# Patient Record
Sex: Male | Born: 1960
Health system: Southern US, Community
[De-identification: ages and names within clinical notes are randomized; demographics above are authoritative.]

## PROBLEM LIST (undated history)

## (undated) DIAGNOSIS — R51 Headache: Secondary | ICD-10-CM

## (undated) DIAGNOSIS — R519 Headache, unspecified: Secondary | ICD-10-CM

## (undated) DIAGNOSIS — R42 Dizziness and giddiness: Secondary | ICD-10-CM

## (undated) DIAGNOSIS — L570 Actinic keratosis: Secondary | ICD-10-CM

## (undated) DIAGNOSIS — I4891 Unspecified atrial fibrillation: Secondary | ICD-10-CM

## (undated) DIAGNOSIS — G4733 Obstructive sleep apnea (adult) (pediatric): Secondary | ICD-10-CM

## (undated) DIAGNOSIS — J392 Other diseases of pharynx: Secondary | ICD-10-CM

## (undated) DIAGNOSIS — J349 Unspecified disorder of nose and nasal sinuses: Secondary | ICD-10-CM

## (undated) DIAGNOSIS — H269 Unspecified cataract: Secondary | ICD-10-CM

## (undated) DIAGNOSIS — H539 Unspecified visual disturbance: Secondary | ICD-10-CM

## (undated) DIAGNOSIS — M799 Soft tissue disorder, unspecified: Secondary | ICD-10-CM

## (undated) DIAGNOSIS — I10 Essential (primary) hypertension: Secondary | ICD-10-CM

## (undated) DIAGNOSIS — H9390 Unspecified disorder of ear, unspecified ear: Secondary | ICD-10-CM

## (undated) DIAGNOSIS — G4730 Sleep apnea, unspecified: Secondary | ICD-10-CM

## (undated) HISTORY — DX: Obstructive sleep apnea (adult) (pediatric): G47.33

## (undated) HISTORY — DX: Actinic keratosis: L57.0

## (undated) HISTORY — PX: NECK SURGERY: SHX720

## (undated) HISTORY — DX: Dizziness and giddiness: R42

## (undated) HISTORY — PX: WISDOM TOOTH EXTRACTION: SHX21

## (undated) HISTORY — DX: Headache, unspecified: R51.9

## (undated) HISTORY — DX: Headache: R51

## (undated) HISTORY — DX: Unspecified atrial fibrillation: I48.91

## (undated) HISTORY — PX: COLONOSCOPY: SHX174

## (undated) HISTORY — DX: Sleep apnea, unspecified: G47.30

## (undated) HISTORY — DX: Unspecified visual disturbance: H53.9

## (undated) HISTORY — DX: Unspecified disorder of nose and nasal sinuses: J34.9

---

## 1996-02-29 ENCOUNTER — Ambulatory Visit: Admit: 1996-02-29 | Disposition: A | Discharge status: Home / Self Care | Payer: Self-pay

## 1998-04-25 ENCOUNTER — Other Ambulatory Visit: Payer: Self-pay

## 1998-04-25 ENCOUNTER — Ambulatory Visit: Admit: 1998-04-25 | Disposition: A | Discharge status: Home / Self Care | Payer: Self-pay | Admitting: Gastroenterology

## 2001-04-17 ENCOUNTER — Ambulatory Visit: Admit: 2001-04-17 | Disposition: A | Discharge status: Home / Self Care | Payer: Self-pay | Admitting: Gastroenterology

## 2001-04-17 ENCOUNTER — Other Ambulatory Visit: Payer: Self-pay

## 2001-12-15 ENCOUNTER — Other Ambulatory Visit: Payer: Self-pay

## 2001-12-15 ENCOUNTER — Ambulatory Visit: Admit: 2001-12-15 | Disposition: A | Discharge status: Home / Self Care | Payer: Self-pay | Admitting: Urology

## 2002-09-06 DIAGNOSIS — G4733 Obstructive sleep apnea (adult) (pediatric): Secondary | ICD-10-CM

## 2002-09-06 HISTORY — DX: Obstructive sleep apnea (adult) (pediatric): G47.33

## 2002-12-03 ENCOUNTER — Ambulatory Visit (HOSPITAL_BASED_OUTPATIENT_CLINIC_OR_DEPARTMENT_OTHER): Admission: RE | Admit: 2002-12-03 | Discharge: 2002-12-03 | Payer: Self-pay | Admitting: Pulmonary Disease

## 2002-12-03 ENCOUNTER — Encounter: Payer: Self-pay | Admitting: Pulmonary Disease

## 2003-06-12 ENCOUNTER — Encounter: Payer: Self-pay | Admitting: Family Medicine

## 2003-06-12 ENCOUNTER — Encounter: Admission: RE | Admit: 2003-06-12 | Discharge: 2003-06-12 | Payer: Self-pay | Admitting: Family Medicine

## 2004-08-03 ENCOUNTER — Ambulatory Visit: Payer: Self-pay | Admitting: Family Medicine

## 2004-08-11 ENCOUNTER — Ambulatory Visit: Payer: Self-pay | Admitting: Family Medicine

## 2005-05-11 ENCOUNTER — Ambulatory Visit
Admission: AD | Admit: 2005-05-11 | Disposition: A | Discharge status: Home / Self Care | Payer: Self-pay | Source: Ambulatory Visit | Admitting: Internal Medicine

## 2005-08-05 ENCOUNTER — Encounter: Admission: RE | Admit: 2005-08-05 | Discharge: 2005-08-05 | Payer: Self-pay | Admitting: Family Medicine

## 2005-08-05 ENCOUNTER — Ambulatory Visit: Payer: Self-pay | Admitting: Family Medicine

## 2005-10-01 ENCOUNTER — Ambulatory Visit: Payer: Self-pay | Admitting: Family Medicine

## 2005-10-08 ENCOUNTER — Ambulatory Visit: Payer: Self-pay | Admitting: Family Medicine

## 2005-10-15 ENCOUNTER — Encounter: Payer: Self-pay | Admitting: Cardiovascular Disease

## 2005-10-15 ENCOUNTER — Ambulatory Visit: Payer: Self-pay | Admitting: Internal Medicine

## 2006-02-18 ENCOUNTER — Ambulatory Visit: Payer: Self-pay | Admitting: Sports Medicine

## 2006-04-05 ENCOUNTER — Ambulatory Visit: Payer: Self-pay | Admitting: Family Medicine

## 2006-08-17 ENCOUNTER — Ambulatory Visit
Admission: RE | Admit: 2006-08-17 | Disposition: A | Discharge status: Home / Self Care | Payer: Self-pay | Source: Ambulatory Visit | Admitting: Gastroenterology

## 2006-10-17 ENCOUNTER — Ambulatory Visit: Payer: Self-pay | Admitting: Family Medicine

## 2006-10-17 LAB — CONVERTED CEMR LAB: Digitoxin Lvl: 1.4 ng/mL (ref 0.8–2.0)

## 2006-11-10 ENCOUNTER — Ambulatory Visit: Payer: Self-pay | Admitting: Family Medicine

## 2006-11-10 LAB — CONVERTED CEMR LAB
Albumin: 4.2 g/dL (ref 3.5–5.2)
Alkaline Phosphatase: 50 units/L (ref 39–117)
BUN: 18 mg/dL (ref 6–23)
Basophils Absolute: 0 10*3/uL (ref 0.0–0.1)
Basophils Relative: 0.4 % (ref 0.0–1.0)
CO2: 31 meq/L (ref 19–32)
Eosinophils Absolute: 0.1 10*3/uL (ref 0.0–0.6)
Eosinophils Relative: 2.6 % (ref 0.0–5.0)
GFR calc Af Amer: 76 mL/min
GFR calc non Af Amer: 63 mL/min
HCT: 44.2 % (ref 39.0–52.0)
LDL Cholesterol: 73 mg/dL (ref 0–99)
Monocytes Absolute: 0.4 10*3/uL (ref 0.2–0.7)
Neutro Abs: 2.9 10*3/uL (ref 1.4–7.7)
Potassium: 3.6 meq/L (ref 3.5–5.1)
RBC: 5.09 M/uL (ref 4.22–5.81)
RDW: 12.3 % (ref 11.5–14.6)
Total CHOL/HDL Ratio: 4.3
Total Protein: 6.8 g/dL (ref 6.0–8.3)
VLDL: 34 mg/dL (ref 0–40)
WBC: 4.7 10*3/uL (ref 4.5–10.5)

## 2006-11-17 ENCOUNTER — Ambulatory Visit: Payer: Self-pay | Admitting: Family Medicine

## 2007-04-28 DIAGNOSIS — R51 Headache: Secondary | ICD-10-CM

## 2007-04-28 DIAGNOSIS — R519 Headache, unspecified: Secondary | ICD-10-CM | POA: Insufficient documentation

## 2007-04-28 DIAGNOSIS — I4891 Unspecified atrial fibrillation: Secondary | ICD-10-CM

## 2007-11-24 ENCOUNTER — Telehealth: Payer: Self-pay | Admitting: Family Medicine

## 2007-12-27 ENCOUNTER — Ambulatory Visit: Payer: Self-pay | Admitting: Family Medicine

## 2007-12-27 LAB — CONVERTED CEMR LAB
Basophils Absolute: 0.2 10*3/uL — ABNORMAL HIGH (ref 0.0–0.1)
Basophils Relative: 3.5 % — ABNORMAL HIGH (ref 0.0–1.0)
Bilirubin Urine: NEGATIVE
Calcium: 9.3 mg/dL (ref 8.4–10.5)
Cholesterol: 143 mg/dL (ref 0–200)
Creatinine, Ser: 1.3 mg/dL (ref 0.4–1.5)
Digitoxin Lvl: 0.7 ng/mL — ABNORMAL LOW (ref 0.8–2.0)
Eosinophils Absolute: 0.1 10*3/uL (ref 0.0–0.7)
GFR calc non Af Amer: 63 mL/min
Glucose, Urine, Semiquant: NEGATIVE
Hemoglobin: 15.3 g/dL (ref 13.0–17.0)
Ketones, urine, test strip: NEGATIVE
MCHC: 33.8 g/dL (ref 30.0–36.0)
MCV: 88 fL (ref 78.0–100.0)
Neutro Abs: 2.9 10*3/uL (ref 1.4–7.7)
RBC: 5.16 M/uL (ref 4.22–5.81)
TSH: 2.23 microintl units/mL (ref 0.35–5.50)
Total Bilirubin: 0.8 mg/dL (ref 0.3–1.2)
VLDL: 21 mg/dL (ref 0–40)
pH: >=9

## 2008-01-02 ENCOUNTER — Encounter: Payer: Self-pay | Admitting: Family Medicine

## 2008-01-03 ENCOUNTER — Ambulatory Visit: Payer: Self-pay | Admitting: Family Medicine

## 2008-01-24 ENCOUNTER — Ambulatory Visit: Payer: Self-pay | Admitting: Family Medicine

## 2008-01-28 DIAGNOSIS — L570 Actinic keratosis: Secondary | ICD-10-CM

## 2008-11-28 ENCOUNTER — Telehealth: Payer: Self-pay | Admitting: Family Medicine

## 2009-02-10 ENCOUNTER — Telehealth: Payer: Self-pay | Admitting: Family Medicine

## 2009-03-17 ENCOUNTER — Ambulatory Visit: Payer: Self-pay | Admitting: Family Medicine

## 2009-03-17 LAB — CONVERTED CEMR LAB
ALT: 22 units/L (ref 0–53)
Albumin: 4.3 g/dL (ref 3.5–5.2)
BUN: 15 mg/dL (ref 6–23)
Basophils Relative: 0.4 % (ref 0.0–3.0)
Bilirubin Urine: NEGATIVE
Chloride: 105 meq/L (ref 96–112)
Cholesterol: 148 mg/dL (ref 0–200)
Digitoxin Lvl: 2.3 ng/mL — ABNORMAL HIGH (ref 0.8–2.0)
Eosinophils Relative: 3.1 % (ref 0.0–5.0)
HCT: 44.3 % (ref 39.0–52.0)
Hemoglobin: 15 g/dL (ref 13.0–17.0)
Ketones, urine, test strip: NEGATIVE
LDL Cholesterol: 83 mg/dL (ref 0–99)
Lymphs Abs: 1.1 10*3/uL (ref 0.7–4.0)
MCV: 87.4 fL (ref 78.0–100.0)
Monocytes Absolute: 0.3 10*3/uL (ref 0.1–1.0)
Neutro Abs: 2.4 10*3/uL (ref 1.4–7.7)
Nitrite: NEGATIVE
PSA: 0.91 ng/mL (ref 0.10–4.00)
Platelets: 163 10*3/uL (ref 150.0–400.0)
Potassium: 3.9 meq/L (ref 3.5–5.1)
Specific Gravity, Urine: 1.025
TSH: 2.38 microintl units/mL (ref 0.35–5.50)
Total Bilirubin: 1 mg/dL (ref 0.3–1.2)
Total Protein: 6.9 g/dL (ref 6.0–8.3)
WBC: 3.9 10*3/uL — ABNORMAL LOW (ref 4.5–10.5)

## 2009-03-18 ENCOUNTER — Encounter: Payer: Self-pay | Admitting: Family Medicine

## 2009-03-24 ENCOUNTER — Ambulatory Visit: Payer: Self-pay | Admitting: Family Medicine

## 2009-04-10 ENCOUNTER — Encounter: Payer: Self-pay | Admitting: Gastroenterology

## 2010-04-13 ENCOUNTER — Telehealth: Payer: Self-pay | Admitting: Family Medicine

## 2010-04-28 ENCOUNTER — Ambulatory Visit: Payer: Self-pay | Admitting: Family Medicine

## 2010-04-28 LAB — CONVERTED CEMR LAB
ALT: 22 units/L (ref 0–53)
Albumin: 4.2 g/dL (ref 3.5–5.2)
Alkaline Phosphatase: 50 units/L (ref 39–117)
Basophils Relative: 0.3 % (ref 0.0–3.0)
Bilirubin, Direct: 0.2 mg/dL (ref 0.0–0.3)
CO2: 29 meq/L (ref 19–32)
Calcium: 9 mg/dL (ref 8.4–10.5)
Chloride: 104 meq/L (ref 96–112)
Digitoxin Lvl: 2.9 ng/mL (ref 0.8–2.0)
Eosinophils Absolute: 0.1 10*3/uL (ref 0.0–0.7)
Eosinophils Relative: 2.6 % (ref 0.0–5.0)
Hemoglobin: 14.5 g/dL (ref 13.0–17.0)
LDL Cholesterol: 58 mg/dL (ref 0–99)
Lymphocytes Relative: 22.7 % (ref 12.0–46.0)
MCHC: 33.5 g/dL (ref 30.0–36.0)
MCV: 88.3 fL (ref 78.0–100.0)
Neutro Abs: 3.2 10*3/uL (ref 1.4–7.7)
Neutrophils Relative %: 66.6 % (ref 43.0–77.0)
Nitrite: NEGATIVE
RBC: 4.9 M/uL (ref 4.22–5.81)
Sodium: 143 meq/L (ref 135–145)
Specific Gravity, Urine: >=1.03
Total CHOL/HDL Ratio: 4
Total Protein: 6.2 g/dL (ref 6.0–8.3)
Urobilinogen, UA: 0.2
WBC: 4.8 10*3/uL (ref 4.5–10.5)

## 2010-05-05 ENCOUNTER — Ambulatory Visit: Payer: Self-pay | Admitting: Family Medicine

## 2010-05-19 ENCOUNTER — Ambulatory Visit: Payer: Self-pay | Admitting: Family Medicine

## 2010-06-03 ENCOUNTER — Ambulatory Visit: Payer: Self-pay | Admitting: Internal Medicine

## 2010-06-03 ENCOUNTER — Ambulatory Visit: Payer: Self-pay | Admitting: Pulmonary Disease

## 2010-06-03 DIAGNOSIS — G4733 Obstructive sleep apnea (adult) (pediatric): Secondary | ICD-10-CM | POA: Insufficient documentation

## 2010-06-03 DIAGNOSIS — R0989 Other specified symptoms and signs involving the circulatory and respiratory systems: Secondary | ICD-10-CM

## 2010-06-03 DIAGNOSIS — R0609 Other forms of dyspnea: Secondary | ICD-10-CM

## 2010-06-14 ENCOUNTER — Emergency Department (HOSPITAL_COMMUNITY): Admission: EM | Admit: 2010-06-14 | Discharge: 2010-06-15 | Payer: Self-pay | Admitting: Emergency Medicine

## 2010-06-16 ENCOUNTER — Encounter: Payer: Self-pay | Admitting: Pulmonary Disease

## 2010-06-19 ENCOUNTER — Ambulatory Visit: Payer: Self-pay | Admitting: Family Medicine

## 2010-06-19 DIAGNOSIS — R42 Dizziness and giddiness: Secondary | ICD-10-CM

## 2010-09-04 ENCOUNTER — Encounter: Payer: Self-pay | Admitting: Family Medicine

## 2010-09-29 ENCOUNTER — Encounter: Payer: Self-pay | Admitting: Family Medicine

## 2010-09-29 ENCOUNTER — Ambulatory Visit
Admission: RE | Admit: 2010-09-29 | Discharge: 2010-09-29 | Payer: Self-pay | Source: Home / Self Care | Attending: Family Medicine | Admitting: Family Medicine

## 2010-09-29 DIAGNOSIS — M5382 Other specified dorsopathies, cervical region: Secondary | ICD-10-CM | POA: Insufficient documentation

## 2010-09-29 NOTE — Progress Notes (Unsigned)
Subjective:     Patient ID: Jeff Gonzales is a 50 y.o. male.  Jeff Gonzales is a 50 year old, married male, nonsmoker, who comes in today with a 3 to 49-month history of a sensation of vibrations in the back of his neck.  Jeff Gonzales had a rather severe episode of vertigo last fall.  The vertigo resolved, but after, that Jeff Gonzales's had recurrent sensations of vibrations in the back of his neck.  It comes and goes.  It's not really pain is not, vertigo it's a sense of pain laying in the back of his neck.  Jeff Gonzales said no history of trauma.  Neurologic review of systems otherwise negative. {Common ambulatory SmartLinks:[id number]}  Review of Systems  HENT: Negative for ear pain.       13 Review of Systems  HENT: Negative for ear pain.    of systems negative Objective:   Physical Exam  Constitutional: Jeff Gonzales is oriented to person, place, and time. Jeff Gonzales appears well-developed and well-nourished.  HENT:  Head: Normocephalic and atraumatic.  Right Ear: External ear normal.  Left Ear: External ear normal.  Nose: Nose normal.  Mouth/Throat: Oropharynx is clear and moist.  Eyes: Conjunctivae and EOM are normal. Pupils are equal, round, and reactive to light.  Neck: Normal range of motion. Neck supple. No JVD present. No tracheal deviation present. No thyromegaly present.  Musculoskeletal: Normal range of motion.  Lymphadenopathy:    Jeff Gonzales has no cervical adenopathy.  Neurological: Jeff Gonzales is alert and oriented to person, place, and time. Jeff Gonzales has normal reflexes. Jeff Gonzales displays normal reflexes. No cranial nerve deficit. Jeff Gonzales exhibits normal muscle tone. Coordination normal.       Assessment:     The    Plan:     ***

## 2010-10-01 ENCOUNTER — Telehealth: Payer: Self-pay | Admitting: Family Medicine

## 2010-10-08 NOTE — Assessment & Plan Note (Signed)
Summary: back of neck vibrating//ccm   Vital Signs:  Patient profile:   50 year old male Weight:      200 pounds Temp:     98.2 degrees F oral BP sitting:   130 / 80  (left arm)  Vitals Entered By: Kern Reap CMA Duncan Dull) (September 29, 2010 12:21 PM) CC: vibration with neck and head   Primary Care Provider:  Roderick Pee MD  CC:  vibration with neck and head.  History of Present Illness: Physical Exam  Constitutional: He is oriented to person, place, and time. He appears well-developed and well-nourished.  HENT:  Head: Normocephalic and atraumatic.  Right Ear: External ear normal.  Left Ear: External ear normal.  Nose: Nose normal.  Mouth/Throat: Oropharynx is clear and moist.  Eyes: Conjunctivae and EOM are normal. Pupils are equal, round, and reactive to light.  Neck: Normal range of motion. Neck supple. No JVD present. No tracheal deviation present. No thyromegaly present.  Musculoskeletal: Normal range of motion.  Lymphadenopathy:    He has no cervical adenopathy.  Neurological: He is alert and oriented to person, place, and time. He has normal reflexes. He displays normal reflexes. No cranial nerve deficit. He exhibits normal muscle tone. Coordination normal.    Allergies: No Known Drug Allergies  Past History:  Past medical, surgical, family and social histories (including risk factors) reviewed, and no changes noted (except as noted below).  Past Medical History: Reviewed history from 06/03/2010 and no changes required. Atrial fibrillation Headache mild osa 2004--AHI 7/hr.  Past Surgical History: Reviewed history from 06/03/2010 and no changes required. neck surgery--age 64 removal of wisdom teeth  Family History: Reviewed history from 06/03/2010 and no changes required. Family History of Melanoma--mom  Social History: Reviewed history from 06/03/2010 and no changes required. Seperated children--2 Regular exercise-yes Former Smoker. quit in 1984.  1/2-1 ppd. Started age 30's.  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 No apparent alopecia or balding. Eyes:  No corneal or conjunctival inflammation noted. EOMI. Perrla. Funduscopic exam benign, without hemorrhages, exudates or papilledema. Vision grossly normal. Ears:  External ear exam shows no significant lesions or deformities.  Otoscopic examination reveals clear canals, tympanic membranes are intact bilaterally without bulging, retraction, inflammation or discharge. Hearing is grossly normal bilaterally. Nose:  External nasal examination shows no deformity or inflammation. Nasal mucosa are pink and moist without lesions or exudates. Mouth:  Oral mucosa and oropharynx without lesions or exudates.  Teeth in good repair. Neck:  No deformities, masses, or tenderness noted. Chest Wall:  No deformities, masses, tenderness or gynecomastia noted. Msk:  No deformity or scoliosis noted of thoracic or lumbar spine.   Pulses:  R and L carotid,radial,femoral,dorsalis pedis and posterior tibial pulses are full and equal bilaterally Extremities:  No clubbing, cyanosis, edema, or deformity noted with normal full range of motion of all joints.   Neurologic:  No cranial nerve deficits noted. Station and gait are normal. Plantar reflexes are down-going bilaterally. DTRs are symmetrical throughout. Sensory, motor and coordinative functions appear intact.   Problems:  Medical Problems Added: 1)  Dx of Cervical Disorder, Nos  (ICD-723.9)  Impression & Recommendations:  Problem # 1:  CERVICAL DISORDER, NOS (ICD-723.9) Assessment New  Orders: T-Cervical Spine Comp w/Flex & Ext (69629BM)  Complete Medication List: 1)  Lanoxin 0.25 Mg Tabs (Digoxin) .... Take 1  tablet by mouth once a day 2)  Quinidine Gluconate Cr 324 Mg Tbcr (Quinidine gluconate) .... Take 1 tablet by mouth five times a day 3)  Ativan 2 Mg Tabs (Lorazepam) .Marland Kitchen.. 1  by mouth two times a day as needed  Patient Instructions: 1)  go to the main office now for x-rays of your neck. 2)  Begin Motrin 600 mg twice daily with food.  I will call her the report on your x-rays Prescriptions: ATIVAN 2 MG TABS (LORAZEPAM) 1  by mouth two times a day as needed  #60 x 2   Entered and Authorized by:   Roderick Pee MD   Signed by:   Roderick Pee MD on 09/29/2010   Method used:   Print then Give to Patient   RxID:   [id number]    Orders Added: 1)  T-Cervical Spine Comp w/Flex & Ext [72052TC] 2)  Est. Patient Level III [14782]

## 2010-10-08 NOTE — Assessment & Plan Note (Signed)
Summary: disoriented feeling/pts head doesnt feel right/cjr   Vital Signs:  Patient profile:   50 year old male Weight:      198 pounds Temp:     98.3 degrees F oral BP sitting:   138 / 80  Vitals Entered By: Lynann Beaver CMA (June 19, 2010 11:13 AM) CC: rov Is Patient Diabetic? No Pain Assessment Patient in pain? no        Primary Care Provider:  Roderick Pee MD  CC:  rov.  History of Present Illness: Destine is a 50 year old single male, nonsmoker, who comes in today for evaluation of vertigo.  Over the weekend he began having a sense of imbalance.  By Sunday night he developed true vertigo became pale nauseated.  His girl friend took him to the emergency room.  They did a complete exam including brain scan all of which of course, were normal.  The vertigo, then went away.  He still feels like he is rocking on about an hour.  He developed anxiety over the vertigo  Current Medications (verified): 1)  Lanoxin 0.25 Mg Tabs (Digoxin) .... Take 1 Tablet By Mouth Once A Day 2)  Quinidine Gluconate Cr 324 Mg Tbcr (Quinidine Gluconate) .... Take 1 Tablet By Mouth Five Times A Day  Allergies (verified): No Known Drug Allergies  Past History:  Past medical, surgical, family and social histories (including risk factors) reviewed for relevance to current acute and chronic problems.  Past Medical History: Reviewed history from 06/03/2010 and no changes required. Atrial fibrillation Headache mild osa 2004--AHI 7/hr.  Past Surgical History: Reviewed history from 06/03/2010 and no changes required. neck surgery--age 70 removal of wisdom teeth  Family History: Reviewed history from 06/03/2010 and no changes required. Family History of Melanoma--mom  Social History: Reviewed history from 06/03/2010 and no changes required. Seperated children--2 Regular exercise-yes Former Smoker. quit in 1984. 1/2-1 ppd. Started age 43's.  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 No apparent alopecia or balding. Eyes:  No corneal or conjunctival inflammation noted. EOMI. Perrla. Funduscopic exam benign, without hemorrhages, exudates or papilledema. Vision grossly normal. Ears:  External ear exam shows no significant lesions or deformities.  Otoscopic examination reveals clear canals, tympanic membranes are intact bilaterally without bulging, retraction, inflammation or discharge. Hearing is grossly normal bilaterally. Nose:  External nasal examination shows no deformity or inflammation. Nasal mucosa are pink and moist without lesions or exudates. Mouth:  Oral mucosa and oropharynx without lesions or exudates.  Teeth in good repair. Neurologic:  No cranial nerve deficits noted. Station and gait are normal. Plantar reflexes are down-going bilaterally. DTRs are symmetrical throughout. Sensory, motor and coordinative functions appear intact.   Problems:  Medical Problems Added: 1)  Dx of Vertigo  (ICD-780.4)  Impression & Recommendations:  Problem # 1:  VERTIGO (ICD-780.4) Assessment New  Complete Medication List: 1)  Lanoxin 0.25 Mg Tabs (Digoxin) .... Take 1 tablet by mouth once a day 2)  Quinidine Gluconate Cr 324 Mg Tbcr (Quinidine gluconate) .... Take 1 tablet by mouth five times a day 3)  Ativan 0.5 Mg Tabs (Lorazepam) .... Take 1 tablet by mouth two times a day as needed  Patient Instructions: 1)  take Ativan .5 mg b.i.d., p.r.n. Prescriptions: ATIVAN 0.5 MG TABS (LORAZEPAM) Take 1 tablet by mouth two  times a day as needed  #30 x 1   Entered and Authorized by:   Roderick Pee MD   Signed by:   Roderick Pee MD on 06/19/2010   Method used:   Print then Give to Patient   RxID:   [id number]

## 2010-10-08 NOTE — Progress Notes (Signed)
Summary: Pt req to get Lanoxin & Quinidine lvls checked added to cpx labs  Phone Note Call from Patient Call back at Home Phone [phone]   Caller: Patient Summary of Call: Pt having cpx labs done on 04/28/10 and is req to get Lanoxin and Quinidine lvs added to cpx labs. Pls advise.          Initial call taken by: Lucy Antigua,  April 13, 2010 1:21 PM  Follow-up for Phone Call        ok to add.  He digoxin and quinidine level Follow-up by: Roderick Pee MD,  April 13, 2010 1:43 PM  Additional Follow-up for Phone Call Additional follow up Details #1::        Added to date pt is coming in for cpx labs.... done.  Additional Follow-up by: Debbra Riding,  April 13, 2010 3:41 PM

## 2010-10-08 NOTE — Assessment & Plan Note (Signed)
Summary: consult for very mild osa   Visit Type:  Initial Consult Copy to:  Kelle Darting MD Primary Provider/Referring Provider:  Roderick Pee MD  CC:  Sleep Consult. Marland Kitchen  History of Present Illness: The pt is a 50y/o male who I have been asked to see for OSA.  His chart is unavailable to me (at [facility]), but his sleep study from 2004 is available and showed very mild osa with AHI of only 7/hr.  The pt at that time decided to treat conservatively due to minimal symptomatology, and comes in today to revisit his sleep apnea.  He is primarily worried about his snoring disturbing his girlfriend's sleep, and whether his sleep apnea may be having an impact on his CV health.  He is still having loud snoring with an abnormal breathing pattern at times, and will have a gasping arousal on rare occasions.  He goes to bed btw 12-1am, and arises at 9am to start his day.  He states that he has always been a "slow riser", and doesn't feel any different now than in the past.  He can have mild intermittant sleep pressure during the day on 2/7 days during the week.  He denies any issues watching tv or movies in the evening, and has no sleepiness issues with driving.  His epworth score is only 2 today.  His weight has been stable over the years.  Problems Prior to Update: 1)  Sleep Apnea, Obstructive  (ICD-327.23) 2)  Snoring  (ICD-786.09) 3)  Actinic Keratosis, Head  (ICD-702.0) 4)  Physical Examination  (ICD-V70.0) 5)  Family History of Melanoma  (ICD-V16.8) 6)  Headache  (ICD-784.0) 7)  Atrial Fibrillation  (ICD-427.31)  Current Medications (verified): 1)  Lanoxin 0.25 Mg Tabs (Digoxin) .... Take 1 Tablet By Mouth Once A Day 2)  Quinidine Gluconate Cr 324 Mg Tbcr (Quinidine Gluconate) .... Take 1 Tablet By Mouth Five Times A Day 3)  Doxycycline Hyclate 100 Mg Caps (Doxycycline Hyclate) .... Take 1 Tablet By Mouth Two Times A Day For Acne  Allergies (verified): No Known Drug Allergies  Past  History:  Past Medical History: Atrial fibrillation Headache mild osa 2004--AHI 7/hr.  Past Surgical History: neck surgery--age 50 removal of wisdom teeth  Family History: Reviewed history from 04/28/2007 and no changes required. Family History of Melanoma--mom  Social History: Reviewed history from 01/03/2008 and no changes required. Seperated children--2 Regular exercise-yes Former Smoker. quit in 1984. 1/2-1 ppd. Started age 50's.  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  The patient denies shortness of breath with activity, shortness of breath at rest, productive cough, non-productive cough, coughing up blood, chest pain, irregular heartbeats, acid heartburn, indigestion, loss of appetite, weight change, abdominal pain, difficulty swallowing, sore throat, tooth/dental problems, headaches, nasal congestion/difficulty breathing through nose, sneezing, itching, ear ache, depression, hand/feet swelling, joint stiffness or pain, rash, change in color of mucus, and fever.    Vital Signs:  Patient profile:   50 year old male Height:      70.75 inches Weight:      201 pounds BMI:     28.33 O2 Sat:      98 % on Room air Pulse rate:   68 / minute BP sitting:   124 / 82  (left arm) Cuff size:   regular  Vitals Entered By: Carver Fila (June 03, 2010 10:44 AM)  O2 Flow:  Room air CC:  Sleep Consult.  Comments meds and allergies updated Phone number updated Carver Fila  June 03, 2010 10:45 AM    Physical Exam  General:  wd male in nad Eyes:  PERRLA and EOMI.   Nose:  moderate turbinate hypertrophy with narrowing. Mouth:  mild elongation of palate, very long uvula Neck:  no jvd, tmg, LN Lungs:  totally clear to auscultation Heart:  rrr, no mrg Abdomen:  soft and nontender, bs+ Extremities:  no edema noted, pulses intact distally no cyanosis  Neurologic:  alert and oriented, moves all  4.   Impression & Recommendations:  Problem # 1:  SLEEP APNEA, OBSTRUCTIVE (ICD-327.23) the pt has a h/o very mild osa with no significant symptoms, and returns today for re-evaluation primarily related to his girlfriends complaint about his snoring.  He has adequate sleep with no alertness issues 5/7 days, and I suspect the other days he has increased upper airway resistance with a little more sleep disruption.  He is not unhappy with his sleep or daytime alertness, and feels it is no different from his symptoms at the time of his sleep study in 2004.  It is very unlikely he has clinically significant sleep apnea, and certainly does not have increased CV risk at this point.  He is really focused on his snoring, and how that can be alleviated.  I have discussed with him dental appliance and upper airway surgery.  He would like to see ENT to discuss upper airway issues, and in the interim would like him to try nasal ICS to see if it will help with turbinate hypertrophy.  I have also given him literature on dental appliances, and can refer if he is interested.  Other Orders: Consultation Level IV (65784) ENT Referral (ENT)  Patient Instructions: 1)  try nasonex 2 sprays each nostril for next 2 weeks to see if helps 2)  consider dental appliance and upper airway surgery we discussed. 3)  can do an unattended home sleep study if you feel that you are more symptomatic during the day.

## 2010-10-08 NOTE — Letter (Signed)
Summary: Referral - not able to see patient  [facility]  [address] [place], [place] [address]   Phone: [phone]  Fax: [phone]    September 04, 2010    Tinnie Gens A. Tawanna Cooler, M.D. [address] [place], [place] [address]   Re:   Jeff Gonzales DOB:  1961-03-21 MRN:   [id number]    Dear Dr. Tawanna Cooler:  Thank you for your kind referral of the above patient.  We have attempted to schedule the recommended procedure Screening Colonoscopy but have not been able to schedule because:   X  The patient was not available by phone and/or has not returned our calls.  ___ The patient declined to schedule the procedure at this time.  We appreciate the referral and hope that we will have the opportunity to treat this patient in the future.    Sincerely,    [facility] [phone]

## 2010-10-08 NOTE — Assessment & Plan Note (Signed)
Summary: cpx/cjr   Vital Signs:  Patient profile:   50 year old male Height:      70.75 inches Weight:      200 pounds BMI:     28.19 Temp:     98.4 degrees F oral BP sitting:   120 / 80  (left arm) Cuff size:   regular  Vitals Entered By: Kathrynn Speed CMA (May 05, 2010 2:12 PM) CC: cpx, review labs, src Flu Vaccine Consent Questions     Do you have a history of severe allergic reactions to this vaccine? no    Any prior history of allergic reactions to egg and/or gelatin? no    Do you have a sensitivity to the preservative Thimersol? no    Do you have a past history of Guillan-Barre Syndrome? no    Do you currently have an acute febrile illness? no    Have you ever had a severe reaction to latex? no    Vaccine information given and explained to patient? yes    Are you currently pregnant? no    Lot Number:[id number]   Exp Date:03/06/2011   Site Given  Left Deltoid IM   CC:  cpx, review labs, and src.  History of Present Illness: Jeff Gonzales  is a 50 year old, divorced male nonsmoker who comes in today for general physical examination  He saw his been in excellent health.  He has had  no chronic health problems except for long-term atrial fibrillation.  It's been controlled with Lanoxin, .25 daily, and quinidine 324 mg 5 times daily.  We discussed this every year about trying to get off the quinidine and dig, however, he's been relaxant to do so since he feels well, and he's been in sinus rhythm for so many  years.  however this year.  He is agreeable to get a consult.  He also takes Zomig for migraine headaches.  However, the side effects, and the Zomig or worsen headaches, therefore, he has discontinued the stomach.  He states last for an hour and then go away.  He gets routine eye care, dental care, tetanus booster 03, seasonal flu shot today, will set up colonoscopy in GI.  He states that last year.  He got recorded message after he had been out of town that he was due for a  colonoscopy.  He actually never talk to anybody.  He was told he had anappointment and then called him and told him he missed the appointment.  He also complains of snoring.  We sent him for sleep apnea evaluation.  A couple years ago, which was negative however, he would like to reconsult with Jeff Gonzales  Current Medications (verified): 1)  Lanoxin 0.25 Mg Tabs (Digoxin) .... Take 1 Tablet By Mouth Once A Day 2)  Quinidine Gluconate Cr 324 Mg Tbcr (Quinidine Gluconate) .... Take 1 Tablet By Mouth Five Times A Day 3)  Zomig Zmt 5 Mg Tbdp (Zolmitriptan) .... Take 1 Tablet By Mouth  Allergies (verified): No Known Drug Allergies  Past History:  Past medical, surgical, family and social histories (including risk factors) reviewed, and no changes noted (except as noted below).  Past Medical History: Reviewed history from 04/28/2007 and no changes required. Atrial fibrillation Headache  Family History: Reviewed history from 04/28/2007 and no changes required. Family History of Melanoma  Social History: Reviewed history from 01/03/2008 and no changes required. 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 No apparent alopecia or balding. Eyes:  No corneal or conjunctival inflammation noted. EOMI. Perrla. Funduscopic exam benign, without hemorrhages, exudates or papilledema. Vision grossly normal. Ears:  External ear exam shows no significant lesions or deformities.  Otoscopic examination reveals clear canals, tympanic membranes are intact bilaterally without bulging, retraction, inflammation or discharge. Hearing is grossly normal bilaterally. Nose:  External nasal examination  shows no deformity or inflammation. Nasal mucosa are pink and moist without lesions or exudates. Mouth:  Oral mucosa and oropharynx without lesions or exudates.  Teeth in good repair. Neck:  No deformities, masses, or tenderness noted. Chest Wall:  No deformities, masses, tenderness or gynecomastia noted. Breasts:  No masses or gynecomastia noted Lungs:  Normal respiratory effort, chest expands symmetrically. Lungs are clear to auscultation, no crackles or wheezes. Heart:  Normal rate and regular rhythm. S1 and S2 normal without gallop, murmur, click, rub or other extra sounds. Abdomen:  Bowel sounds positive,abdomen soft and non-tender without masses, organomegaly or hernias noted. Rectal:  No external abnormalities noted. Normal sphincter tone. No rectal masses or tenderness. Genitalia:  Testes bilaterally descended without nodularity, tenderness or masses. No scrotal masses or lesions. No penis lesions or urethral discharge. Prostate:  Prostate gland firm and smooth, no enlargement, nodularity, tenderness, mass, asymmetry or induration. Msk:  No deformity or scoliosis noted of thoracic or lumbar spine.   Pulses:  R and L carotid,radial,femoral,dorsalis pedis and posterior tibial pulses are full and equal bilaterally Extremities:  No clubbing, cyanosis, edema, or deformity noted with normal full range of motion of all joints.   Neurologic:  No cranial nerve deficits noted. Station and gait are normal. Plantar reflexes are down-going bilaterally. DTRs are symmetrical throughout. Sensory, motor and coordinative functions appear intact. Skin:  Intact without suspicious lesions or rashes.... except for some mild acne on his back.  Will put him on some doxycycline 100 b.i.d. for a couple days Cervical Nodes:  No lymphadenopathy noted Axillary Nodes:  No palpable lymphadenopathy Inguinal Nodes:  No significant adenopathy Psych:  Cognition and judgment appear intact. Alert and cooperative with normal  attention span and concentration. No apparent delusions, illusions, hallucinations   Impression & Recommendations:  Problem # 1:  PHYSICAL EXAMINATION (ICD-V70.0) Assessment Unchanged  Orders: Prescription Created Electronically [phone]) EKG w/ Interpretation (93000)  Problem # 2:  HEADACHE (ICD-784.0) Assessment: Improved  The following medications were removed from the medication list:    Zomig Zmt 5 Mg Tbdp (Zolmitriptan) .Marland Kitchen... Take 1 tablet by mouth  Problem # 3:  ATRIAL FIBRILLATION (ICD-427.31) Assessment: Unchanged  His updated medication list for this problem includes:    Lanoxin 0.25 Mg Tabs (Digoxin) .Marland Kitchen... Take 1 tablet by mouth once a day    Quinidine Gluconate Cr 324 Mg Tbcr (Quinidine gluconate) .Marland Kitchen... Take 1 tablet by mouth five times a day  Orders: Venipuncture (91478) Prescription Created Electronically [id number]) EKG w/ Interpretation (93000) Cardiology Referral (Cardiology)  Complete Medication List: 1)  Lanoxin 0.25 Mg Tabs (Digoxin) .... Take 1 tablet by mouth once a day 2)  Quinidine Gluconate Cr 324 Mg Tbcr (Quinidine gluconate) .... Take 1 tablet by mouth five times a day 3)  Doxycycline Hyclate 100 Mg Caps (Doxycycline hyclate) .... Take 1 tablet by mouth two times a day for acne  Other Orders: Admin 1st Vaccine (13086) Flu Vaccine  1yrs + [phone]) Gastroenterology Referral (GI) Pulmonary Referral (Pulmonary)  Patient Instructions: 1)  take doxycycline 100 mg b.i.d. to clear up the  acne.  2)  Return in two weeks for follow-up.  Dig.  Level. 3)  I will call and make an appointment before you to  see Dr. Alinda Deem for a cardiac evaluation. 4)  I will also put a note in the system to have somebody call you personally about getting a screening colonoscopy. 5)  It is important that you exercise regularly at least 20 minutes 5 times a week. If you develop chest pain, have severe difficulty breathing, or feel very tired , stop exercising immediately and seek  medical attention. 6)  Take an Aspirin every day. 7)  I will also put a note to reconsult with Dr. Shelle Iron  Prescriptions: DOXYCYCLINE HYCLATE 100 MG CAPS (DOXYCYCLINE HYCLATE) Take 1 tablet by mouth two times a day for acne  #100 x 3   Entered and Authorized by:   Roderick Pee MD   Signed by:   Roderick Pee MD on 05/05/2010   Method used:   Electronically to        [facility]* (retail)       [address]       [place], [place]  [address]       Ph: [phone]       Fax: [phone]   RxID:   [id number] QUINIDINE GLUCONATE CR 324 MG TBCR (QUINIDINE GLUCONATE) Take 1 tablet by mouth five times a day  #500 x 3   Entered and Authorized by:   Roderick Pee MD   Signed by:   Roderick Pee MD on 05/05/2010   Method used:   Electronically to        [facility]* (retail)       [address]       [place], [place]  [id number]       Ph: [phone]       Fax: [phone]   RxID:   [id number] LANOXIN 0.25 MG TABS (DIGOXIN) Take 1 tablet by mouth once a day  #100 x 3   Entered and Authorized by:   Roderick Pee MD   Signed by:   Roderick Pee MD on 05/05/2010   Method used:   Electronically to        [facility]* (retail)       [address]       [place], [place]  [address]       Ph: [phone]       Fax: [phone]   RxID:   [id number]

## 2010-10-08 NOTE — Assessment & Plan Note (Signed)
Summary: nep/afib pt on digoxin & quindin/jml   Visit Type:  Initial Consult Primary Provider:  Roderick Pee MD   History of Present Illness: Jeff Gonzales is referred today by Dr. Tawanna Cooler for evaluation of atrial fibrillation.  His history is unusual in that he was diagnosed with atrial fib initially over 20 yrs ago.  He has had 3 episodes, each occurring once a year until he was placed on Quinidine by a [place] cardiologist.  Since then he has done well.  He has had occaisional palpitations but no symptomatic sustained atrial fibrillation.  He has not had syncope or c/p and continues to exercise regularly.  No peripheral edema.  Current Medications (verified): 1)  Lanoxin 0.25 Mg Tabs (Digoxin) .... Take 1 Tablet By Mouth Once A Day 2)  Quinidine Gluconate Cr 324 Mg Tbcr (Quinidine Gluconate) .... 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  Vital Signs:  Patient profile:   50 year old male Height:      70.75 inches Weight:      198 pounds BMI:     27.91 Pulse rate:   64 / minute BP sitting:   118 / 82  (left arm)  Vitals Entered By: Laurance Flatten CMA (June 03, 2010 12:21 PM)  Physical Exam  General:  Well-developed,well-nourished,in no acute distress; alert,appropriate and cooperative throughout examination Head:  Normocephalic and atraumatic without obvious abnormalities. No apparent alopecia or balding. Eyes:  PERRLA/EOM intact; conjunctiva and lids normal. Mouth:  Oral mucosa and oropharynx without lesions  or exudates.  Teeth in good repair. Neck:  No deformities, masses, or tenderness noted. Lungs:  Normal respiratory effort, chest expands symmetrically. Lungs are clear to auscultation, no crackles or wheezes. Heart:  Normal rate and regular rhythm. S1 and S2 normal without gallop, murmur, click, rub or other extra sounds. Abdomen:  Bowel sounds positive,abdomen soft and non-tender without masses, organomegaly or hernias noted. Msk:  No deformity or scoliosis noted of thoracic or lumbar spine.   Pulses:  R and L carotid,radial,femoral,dorsalis pedis and posterior tibial pulses are full and equal bilaterally Extremities:  No clubbing, cyanosis, edema, or deformity noted with normal full range of motion of all joints.   Neurologic:  Alert and oriented x 3.   EKG  Procedure date:  05/05/2010  Findings:      Normal sinus rhythm with rate of:  60.  Impression & Recommendations:  Problem # 1:  ATRIAL FIBRILLATION (ICD-427.31) I had an extensive discussion with the patient today regarding the etiology, pathophysiology, treatment and complications of atrial fibrillation.  As he has done well on his current meds, I have asked him to continue his quinidine and reduce his digoxin dose as below. His updated medication list for this problem includes:    Lanoxin 0.25 Mg Tabs (Digoxin) .Marland Kitchen... Take 1 tablet by mouth once a day    Quinidine Gluconate Cr 324 Mg Tbcr (Quinidine gluconate) .Marland Kitchen... Take 1 tablet by mouth five times a day  Patient Instructions: 1)  Your physician wants you to follow-up in: 2 years with  DrTaylor or as needed  You will receive a reminder letter in the mail two months in advance. If you don't receive a letter, please call our office to schedule the follow-up appointment.

## 2010-10-08 NOTE — Consult Note (Signed)
Summary: [facility]  [facility]   Imported By: Sherian Rein 07/13/2010 12:35:12  _____________________________________________________________________  External Attachment:    Type:   Image     Comment:   External Document

## 2010-10-08 NOTE — Progress Notes (Signed)
Summary: x-ray results  Phone Note Outgoing Call Call back at [facility] Phone [phone]   Caller: Patient Summary of Call: patient is aware of his x-ray resutls but would like a little more details about results and treatment plan.  he would like a call from dr Romolo Sieling.  Initial call taken by: Kern Reap CMA Duncan Dull),  October 01, 2010 12:33 PM Summary of Call: I reviewed the x-ray findings.  Recommended OTC Motrin, 600 mg b.i.d. with food.  If symptoms persist, and call, and we will get him set up for physical therapy Initial call taken by: Roderick Pee MD,  October 01, 2010 5:46 PM

## 2010-11-19 LAB — POCT CARDIAC MARKERS
CKMB, poc: <1 ng/mL — ABNORMAL LOW (ref 1.0–8.0)
Myoglobin, poc: 62.8 ng/mL (ref 12–200)
Troponin i, poc: <0.05 ng/mL (ref 0.00–0.09)

## 2010-11-19 LAB — CBC
Hemoglobin: 15 g/dL (ref 13.0–17.0)
MCHC: 33.6 g/dL (ref 30.0–36.0)
Platelets: 180 10*3/uL (ref 150–400)
RDW: 12.9 % (ref 11.5–15.5)

## 2010-11-19 LAB — COMPREHENSIVE METABOLIC PANEL
ALT: 25 U/L (ref 0–53)
Calcium: 9.6 mg/dL (ref 8.4–10.5)
GFR calc Af Amer: >60 mL/min (ref >60)
Glucose, Bld: 111 mg/dL — ABNORMAL HIGH (ref 70–99)
Sodium: 139 mEq/L (ref 135–145)
Total Protein: 7.2 g/dL (ref 6.0–8.3)

## 2010-11-19 LAB — DIFFERENTIAL
Eosinophils Absolute: 0 10*3/uL (ref 0.0–0.7)
Lymphs Abs: 1 10*3/uL (ref 0.7–4.0)
Monocytes Relative: 6 % (ref 3–12)
Neutrophils Relative %: 82 % — ABNORMAL HIGH (ref 43–77)

## 2011-01-26 ENCOUNTER — Other Ambulatory Visit: Payer: Self-pay | Admitting: Family Medicine

## 2011-04-24 ENCOUNTER — Emergency Department (HOSPITAL_COMMUNITY)
Admission: EM | Admit: 2011-04-24 | Discharge: 2011-04-24 | Disposition: A | Discharge status: Home / Self Care | Payer: 59 | Attending: Emergency Medicine | Admitting: Emergency Medicine

## 2011-04-24 ENCOUNTER — Emergency Department (HOSPITAL_COMMUNITY): Payer: 59

## 2011-04-24 DIAGNOSIS — X58XXXA Exposure to other specified factors, initial encounter: Secondary | ICD-10-CM | POA: Insufficient documentation

## 2011-04-24 DIAGNOSIS — I4891 Unspecified atrial fibrillation: Secondary | ICD-10-CM | POA: Insufficient documentation

## 2011-04-24 DIAGNOSIS — M25579 Pain in unspecified ankle and joints of unspecified foot: Secondary | ICD-10-CM | POA: Insufficient documentation

## 2011-04-24 DIAGNOSIS — S93499A Sprain of other ligament of unspecified ankle, initial encounter: Secondary | ICD-10-CM | POA: Insufficient documentation

## 2011-04-26 ENCOUNTER — Telehealth: Payer: Self-pay | Admitting: *Deleted

## 2011-04-26 NOTE — Telephone Encounter (Signed)
fyi patient went to ER for treatment.  He is scheduling follow up with ortho.

## 2011-04-26 NOTE — Telephone Encounter (Signed)
He can call [facility] To be seen today

## 2011-04-26 NOTE — Telephone Encounter (Signed)
[facility] Triage Call Report Triage Record Num: [id number] Operator: Craig Guess Patient Name: Jeff Gonzales Call Date & Time: 04/24/2011 2:43:16AM Patient Phone: [phone] PCP: Eugenio Hoes. Todd Patient Gender: Male PCP Fax : [phone] Patient DOB: 02-05-1961 Practice Name: [facility] Reason for Call: Caller reports he thinks he has ruptured his right achilles tendon while he was jumping a few hrs ago. Reports he felt a pop and had intense pain that caused him to fall. Caller is unable to ambulate without assistance and is asking for direction. Minimal swelling and no pain reported. Per Leg Injury, Caller advised to call the [place] office at [facility] to be seen in the am. Home care for sxs. He is agreeable. Protocol(s) Used: Leg Injury Recommended Outcome per Protocol: Provide Home/Self Care Override Outcome if Used in Protocol: See Provider within 24 hours RN Reason for Override Outcome: Nursing Judgement Used. Reason for Outcome: All other situations Care Advice: ~ Avoid activity that causes or worsens symptoms. Limit Swelling and Reduce Pain: - Rest the painful area and limit weight bearing or gripping, and any strenuous exercise, especially repetitive motion, playing tennis, or any activity that makes the pain worse. - Apply a cloth-covered cold pack to the extremity for no more than 20 minutes, 4 to 8 times a day. Cold helps relieve pain and swelling. - Apply an elastic bandage to the extremity to limit the swelling. Do not wrap extremity too tightly. - Elevate the extremity above the level of the heart. ~ Analgesic/Antipyretic Advice - NSAIDs: Consider aspirin, ibuprofen, naproxen or ketoprofen for pain or fever as directed on label or by pharmacist/provider. PRECAUTIONS: - If over 53 years of age, should not take longer than 1 week without consulting provider. EXCEPTIONS: - Should not be used if taking blood thinners or have bleeding problems. - Do not use if  have history of sensitivity/allergy to any of these medications; or history of cardiovascular, ulcer, kidney, liver disease or diabetes unless approved by provider. - Do not exceed recommended dose or frequency. ~ 04/24/2011 2:57:20AM Page 1 of 1 [id number]

## 2011-05-09 ENCOUNTER — Other Ambulatory Visit: Payer: Self-pay | Admitting: Family Medicine

## 2011-06-11 ENCOUNTER — Telehealth: Payer: Self-pay | Admitting: Family Medicine

## 2011-06-11 DIAGNOSIS — I4891 Unspecified atrial fibrillation: Secondary | ICD-10-CM

## 2011-06-11 NOTE — Telephone Encounter (Signed)
Patient is to have cpx labs--v70.0 on next Tuesday 06-15-2011. He would like to add his Digoxin and Quinidine labs added. Is this ok?

## 2011-06-14 NOTE — Telephone Encounter (Signed)
Labs have been added

## 2011-06-14 NOTE — Telephone Encounter (Signed)
ok 

## 2011-06-15 ENCOUNTER — Other Ambulatory Visit (INDEPENDENT_AMBULATORY_CARE_PROVIDER_SITE_OTHER): Payer: 59

## 2011-06-15 DIAGNOSIS — I4891 Unspecified atrial fibrillation: Secondary | ICD-10-CM

## 2011-06-15 DIAGNOSIS — Z0000 Encounter for general adult medical examination without abnormal findings: Secondary | ICD-10-CM

## 2011-06-15 LAB — HEPATIC FUNCTION PANEL
ALT: 25 U/L (ref 0–53)
Albumin: 4.7 g/dL (ref 3.5–5.2)
Total Bilirubin: 0.8 mg/dL (ref 0.3–1.2)

## 2011-06-15 LAB — POCT URINALYSIS DIPSTICK
Bilirubin, UA: NEGATIVE
Glucose, UA: NEGATIVE
Leukocytes, UA: NEGATIVE
Nitrite, UA: NEGATIVE
Urobilinogen, UA: 0.2
pH, UA: 7

## 2011-06-15 LAB — CBC WITH DIFFERENTIAL/PLATELET
Basophils Relative: 0.4 % (ref 0.0–3.0)
Hemoglobin: 15.1 g/dL (ref 13.0–17.0)
Lymphocytes Relative: 19.3 % (ref 12.0–46.0)
Monocytes Relative: 6.6 % (ref 3.0–12.0)
Neutro Abs: 4.5 10*3/uL (ref 1.4–7.7)
RBC: 5.23 Mil/uL (ref 4.22–5.81)
WBC: 6.3 10*3/uL (ref 4.5–10.5)

## 2011-06-15 LAB — LIPID PANEL
HDL: 35.3 mg/dL — ABNORMAL LOW (ref >39.00)
Total CHOL/HDL Ratio: 4
VLDL: 31.6 mg/dL (ref 0.0–40.0)

## 2011-06-15 LAB — BASIC METABOLIC PANEL
Calcium: 9.4 mg/dL (ref 8.4–10.5)
GFR: 77.9 mL/min (ref >60.00)
Sodium: 141 mEq/L (ref 135–145)

## 2011-06-15 LAB — TSH: TSH: 2.34 u[IU]/mL (ref 0.35–5.50)

## 2011-06-16 LAB — QUINIDINE LEVEL: Quinidine: 2.1 ug/mL (ref 2.0–5.0)

## 2011-06-24 ENCOUNTER — Encounter: Payer: Self-pay | Admitting: Family Medicine

## 2011-06-24 ENCOUNTER — Ambulatory Visit (INDEPENDENT_AMBULATORY_CARE_PROVIDER_SITE_OTHER): Payer: 59 | Admitting: Family Medicine

## 2011-06-24 DIAGNOSIS — I4891 Unspecified atrial fibrillation: Secondary | ICD-10-CM

## 2011-06-24 DIAGNOSIS — Z23 Encounter for immunization: Secondary | ICD-10-CM

## 2011-06-24 DIAGNOSIS — Z113 Encounter for screening for infections with a predominantly sexual mode of transmission: Secondary | ICD-10-CM

## 2011-06-24 MED ORDER — QUINIDINE GLUCONATE ER 324 MG PO TBCR: 324.0000 mg | EXTENDED_RELEASE_TABLET | Freq: Every day | ORAL | Status: DC

## 2011-06-24 MED ORDER — LORAZEPAM 1 MG PO TABS: 1.0000 mg | ORAL_TABLET | Freq: Two times a day (BID) | ORAL | Status: AC

## 2011-06-24 MED ORDER — DIGOXIN 250 MCG PO TABS: 250.0000 ug | ORAL_TABLET | ORAL | Status: DC

## 2011-06-24 NOTE — Progress Notes (Signed)
  Subjective:    Patient ID: Jeff Gonzales, male    DOB: 10/03/1960, 50 y.o.   MRN: [id number]  HPI Jeff Gonzales is a 50 year old single male, nonsmoker, who comes in today for a general physical examination because of a history of atrial fibrillation.  This occurred years ago, and he's been maintained on Lanoxin, .25 mg daily, and quinidine 324 mg 5 times daily.  A couple years ago.  We had him see a cardiologist.  In order to change his medication.  He was actively hospitalized and monitored.  He is content to take his current medication because it continued to work and is having no symptoms.  He recently had surgery torn right Achilles' tendon.  He has occasional issues over the last couple weeks with blood in his semen.  Also, his new girlfriend wants him to be tested for STDs, although he is asymptomatic.   Review of Systems  Constitutional: Negative.   HENT: Negative.   Eyes: Negative.   Respiratory: Negative.   Cardiovascular: Negative.   Gastrointestinal: Negative.   Genitourinary: Negative.   Musculoskeletal: Negative.   Skin: Negative.   Neurological: Negative.   Hematological: Negative.   Psychiatric/Behavioral: Negative.        Objective:   Physical Exam  Constitutional: He is oriented to person, place, and time. He appears well-developed and well-nourished.  HENT:  Head: Normocephalic and atraumatic.  Right Ear: External ear normal.  Left Ear: External ear normal.  Nose: Nose normal.  Mouth/Throat: Oropharynx is clear and moist.  Eyes: Conjunctivae and EOM are normal. Pupils are equal, round, and reactive to light.  Neck: Normal range of motion. Neck supple. No JVD present. No tracheal deviation present. No thyromegaly present.  Cardiovascular: Normal rate, regular rhythm, normal heart sounds and intact distal pulses.  Exam reveals no gallop and no friction rub.   No murmur heard. Pulmonary/Chest: Effort normal and breath sounds normal. No stridor. No respiratory  distress. He has no wheezes. He has no rales. He exhibits no tenderness.  Abdominal: Soft. Bowel sounds are normal. He exhibits no distension and no mass. There is no tenderness. There is no rebound and no guarding.  Genitourinary: Rectum normal, prostate normal and penis normal. Guaiac negative stool. No penile tenderness.  Musculoskeletal: Normal range of motion. He exhibits no edema and no tenderness.       He is wearing a boot on his right leg, where he had the Achilles' tendon repaired  Lymphadenopathy:    He has no cervical adenopathy.  Neurological: He is alert and oriented to person, place, and time. He has normal reflexes. No cranial nerve deficit. He exhibits normal muscle tone.  Skin: Skin is warm and dry. No rash noted. No erythema. No pallor.  Psychiatric: He has a normal mood and affect. His behavior is normal. Judgment and thought content normal.          Assessment & Plan:  Healthy male.  History of atrial fibrillation.  Continue Lanoxin quinidine.  Follow-up in one year.  History of mild anxiety.  Ativan 1 mg p.r.n.  Blood in semen reassured.  Urology consult p.r.n.  Ruptured right Achilles' tendon.  Healing.  Return in one year, sooner for any problems

## 2011-06-24 NOTE — Patient Instructions (Signed)
Continue your good health habits.  If the bleeding continues, I would hold the aspirin for a couple weeks.  If the bleeding will not resolve, then I would recommend a urology consult.  Follow-up in one year, sooner if any problems.  We will get a screening HIV test for reassurance and send you a copy.

## 2011-08-18 ENCOUNTER — Ambulatory Visit (INDEPENDENT_AMBULATORY_CARE_PROVIDER_SITE_OTHER): Payer: 59 | Admitting: Family Medicine

## 2011-08-18 ENCOUNTER — Encounter: Payer: Self-pay | Admitting: Family Medicine

## 2011-08-18 ENCOUNTER — Telehealth: Payer: Self-pay | Admitting: Family Medicine

## 2011-08-18 VITALS — BP 120/80 | HR 91 | Temp 98.1°F | Wt 204.0 lb

## 2011-08-18 DIAGNOSIS — IMO0002 Reserved for concepts with insufficient information to code with codable children: Secondary | ICD-10-CM

## 2011-08-18 MED ORDER — DOXYCYCLINE HYCLATE 100 MG PO TABS: 100.0000 mg | ORAL_TABLET | Freq: Two times a day (BID) | ORAL | Status: AC

## 2011-08-18 NOTE — Progress Notes (Signed)
  Subjective:    Patient ID: Jeff Gonzales, male    DOB: [birth year], 50 y.o.   MRN: [id number]  HPI 50 year old white male, former smoker, and with complaints of red, swollen, tender on his right hand has been present for 3 days and worsening. He has been trying saltwater soaks in warm water soaks at home with no relief. He is unsure of what happens with. No injury. It   Review of Systems Review of systems as stated above otherwise negative    Objective:   Physical Exam  Alert and oriented, anxious, in no acute distress. Lungs: Clear cardiac: Regular rate and rhythm skin: Right shows a moderate sized paronychia.  Incision and drainage: Under clean technique, area was excised with an 18-gauge needle. A moderate amount of purulent drainage from the thumb. The thumb was soaked in peroxide for 10 minutes. The results achieved. Triple antibiotic ointment applied topically and wrapped with a bandage.      Assessment & Plan:  Assessment: Paronychia  Plan: Doxycycline 100 mg twice a day for 7 days #14 with no additional refills. Saltwater soaks at home when necessary. Call if symptoms worsen or persist. Recheck when necessary and as scheduled prn.

## 2011-08-18 NOTE — Telephone Encounter (Signed)
Pt called and has a infection in rt thumb. Area is swollen,red,white and purple. Extremely sore. Pt is req a call back from nurse asap.

## 2011-08-18 NOTE — Patient Instructions (Signed)
Paronychia Paronychia is an inflammatory reaction involving the folds of the skin surrounding the fingernail. This is commonly caused by an infection in the skin around a nail. The most common cause of paronychia is frequent wetting of the hands (as seen with bartenders, food servers, nurses or others who wet their hands). This makes the skin around the fingernail susceptible to infection by bacteria (germs) or fungus. Other predisposing factors are:  Aggressive manicuring.   Nail biting.   Thumb sucking.  The most common cause is a staphylococcal (a type of germ) infection, or a fungal (Candida) infection. When caused by a germ, it usually comes on suddenly with redness, swelling, pus and is often painful. It may get under the nail and form an abscess (collection of pus), or form an abscess around the nail. If the nail itself is infected with a fungus, the treatment is usually prolonged and may require oral medicine for up to one year. Your caregiver will determine the length of time treatment is required. The paronychia caused by bacteria (germs) may largely be avoided by not pulling on hangnails or picking at cuticles. When the infection occurs at the tips of the finger it is called felon. When the cause of paronychia is from the herpes simplex virus (HSV) it is called herpetic whitlow. TREATMENT  When an abscess is present treatment is often incision and drainage. This means that the abscess must be cut open so the pus can get out. When this is done, the following home care instructions should be followed. HOME CARE INSTRUCTIONS   It is important to keep the affected fingers very dry. Rubber or plastic gloves over cotton gloves should be used whenever the hand must be placed in water.   Keep wound clean, dry and dressed as suggested by your caregiver between warm soaks or warm compresses.   Soak in warm water for fifteen to twenty minutes three to four times per day for bacterial infections.  Fungal infections are very difficult to treat, so often require treatment for long periods of time.   For bacterial (germ) infections take antibiotics (medicine which kill germs) as directed and finish the prescription, even if the problem appears to be solved before the medicine is gone.   Only take over-the-counter or prescription medicines for pain, discomfort, or fever as directed by your caregiver.  SEEK IMMEDIATE MEDICAL CARE IF:  You have redness, swelling, or increasing pain in the wound.   You notice pus coming from the wound.   You have a fever.   You notice a bad smell coming from the wound or dressing.  Document Released: 02/16/2001 Document Revised: 05/05/2011 Document Reviewed: 10/18/2008 [employer] Patient Information 2012 [employer]. 

## 2011-08-18 NOTE — Telephone Encounter (Signed)
Pt states his thumb has been like this for a day or two and he read that he needed to seek treatment immediately.  Pt aware of appt on 08/18/11 at 3:30.

## 2011-11-26 ENCOUNTER — Ambulatory Visit
Admission: AD | Admit: 2011-11-26 | Discharge: 2011-11-26 | Disposition: A | Discharge status: Home / Self Care | Payer: Self-pay | Source: Ambulatory Visit | Attending: Gastroenterology | Admitting: Gastroenterology

## 2012-06-07 IMAGING — CT CT HEAD W/O CM
2 series · 16 of 30 positions shown, 20 images · non-contrast
Comparison: None.

CLINICAL DATA: Sweating.  Lightheadedness.  Dizziness.

CT HEAD WITHOUT CONTRAST
TECHNIQUE: Contiguous axial images were obtained from the base of
the skull through the vertex without contrast.

[Series 2: head w/o · axial · non-contrast · 0.50mm/px · z∈[+58,+188]mm · 13 of 32 slices shown, 17 images]
[im 3/32  brain]
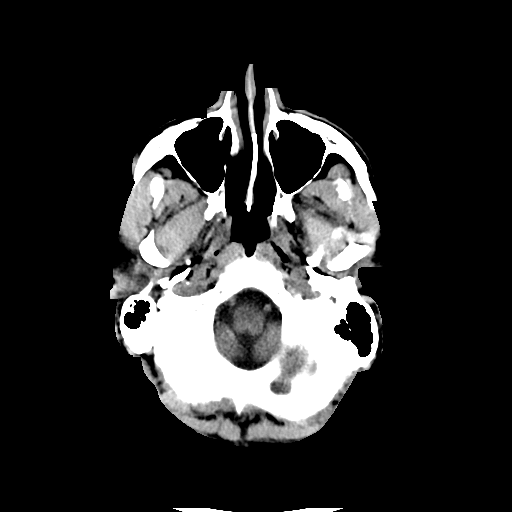
[im 3/32  bone]
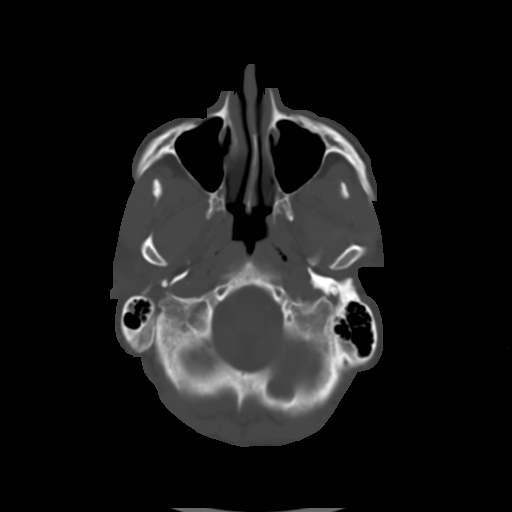
[im 5/32  brain]
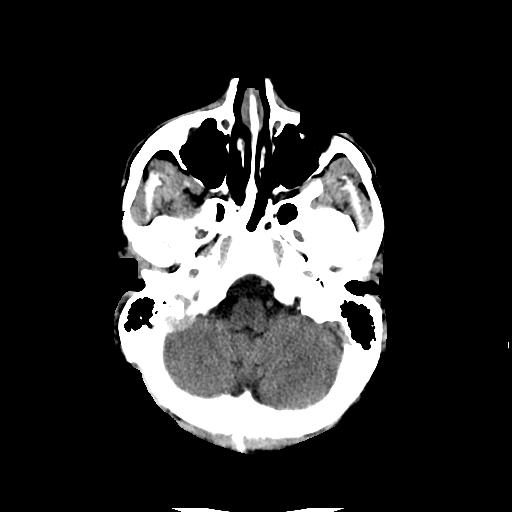
[im 7/32  brain]
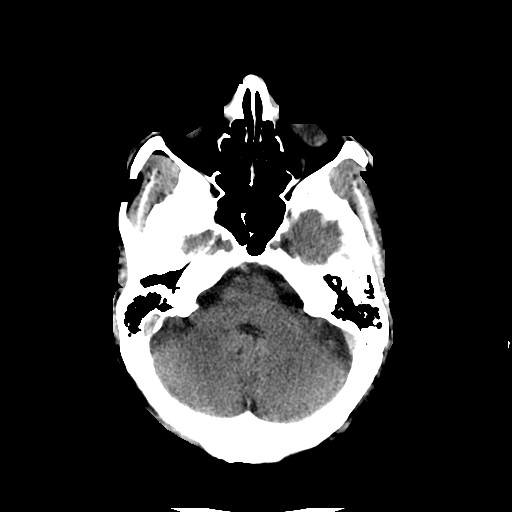
[im 9/32  brain]
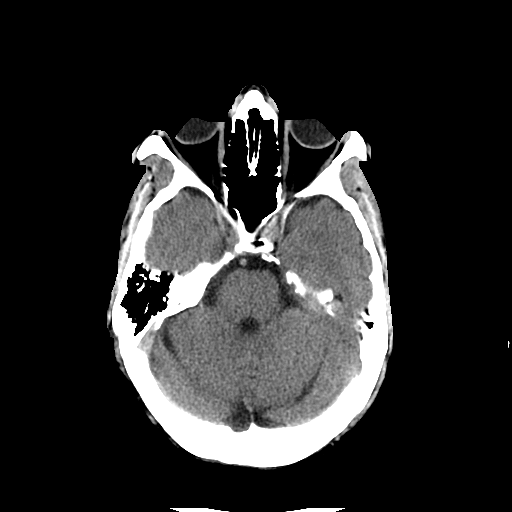
[im 12/32  brain]
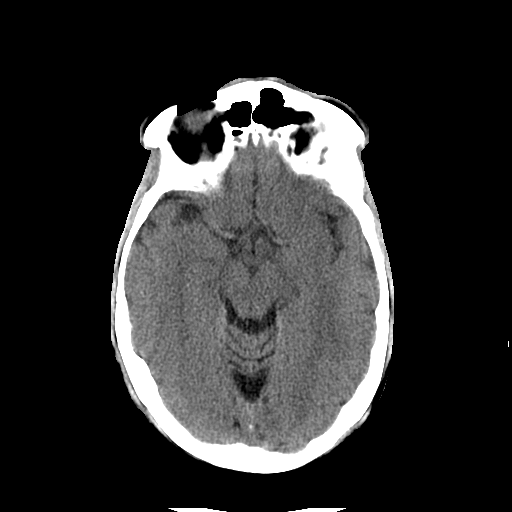
[im 12/32  bone]
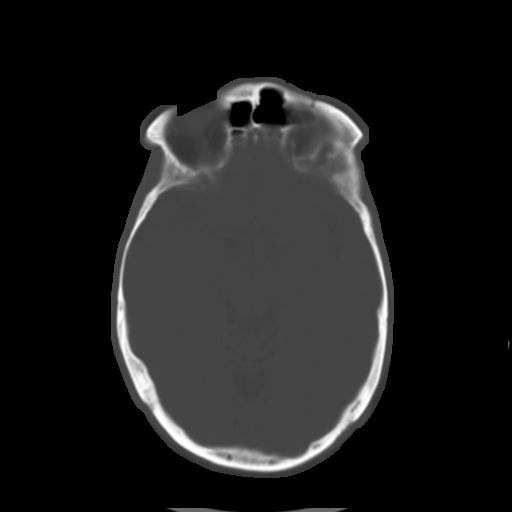
[im 14/32  brain]
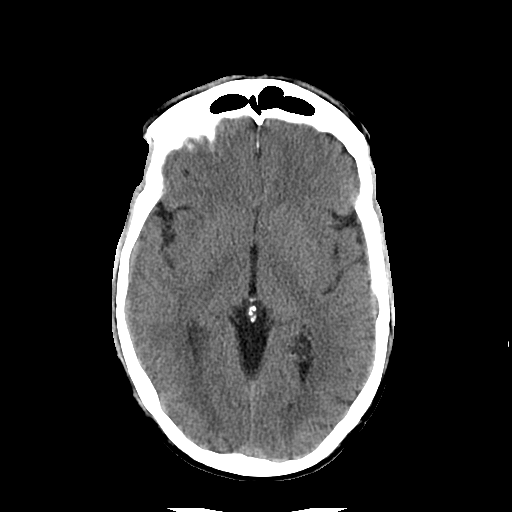
[im 16/32  brain]
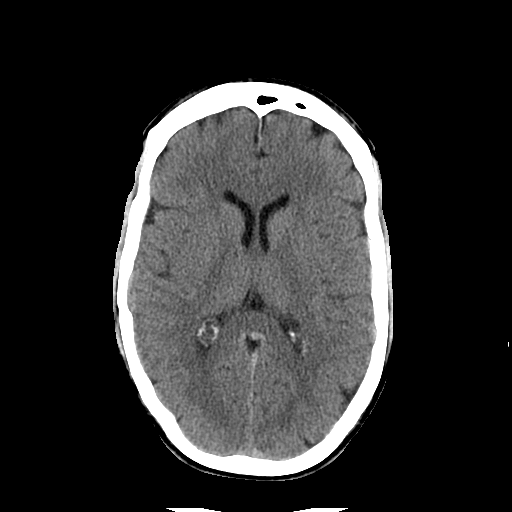
[im 18/32  brain]
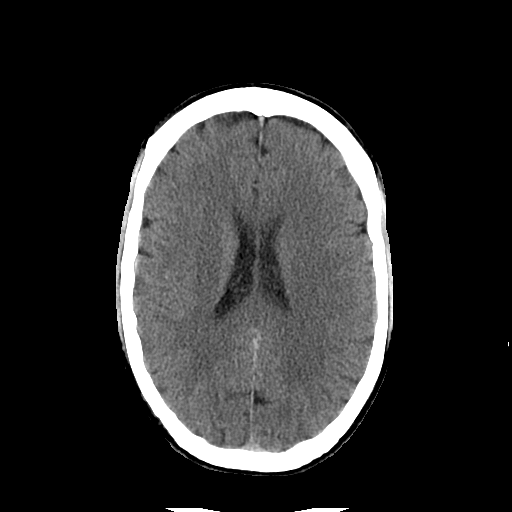
[im 20/32  brain]
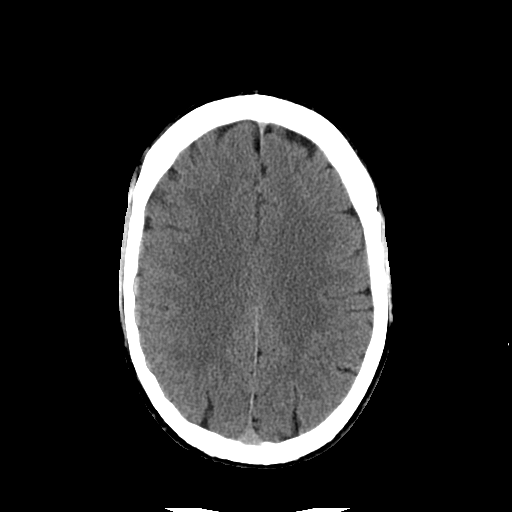
[im 20/32  bone]
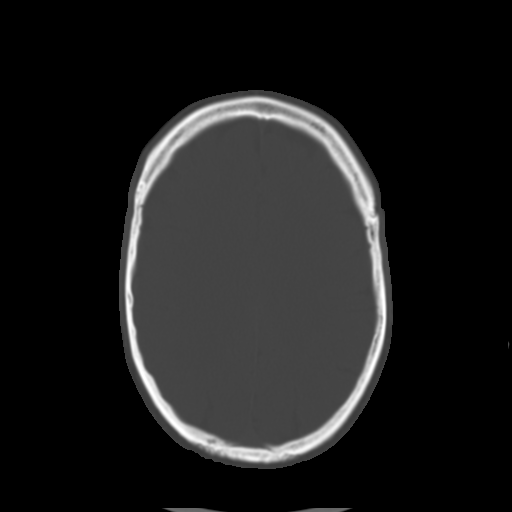
[im 23/32  brain]
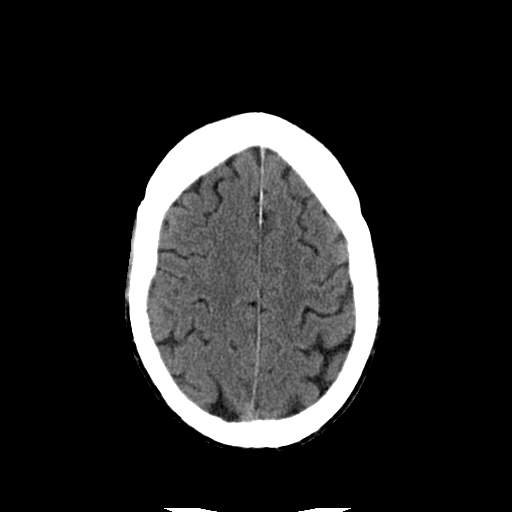
[im 25/32  brain]
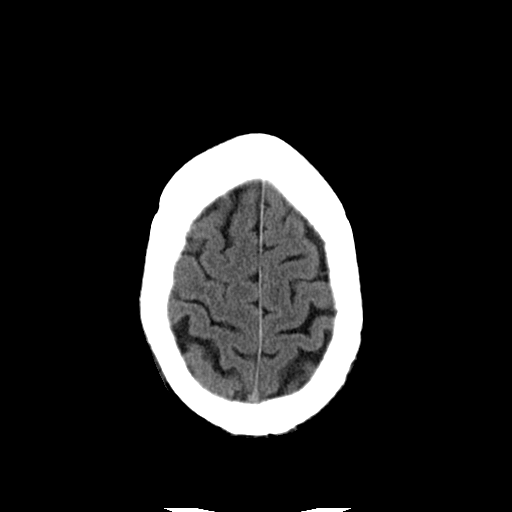
[im 27/32  brain]
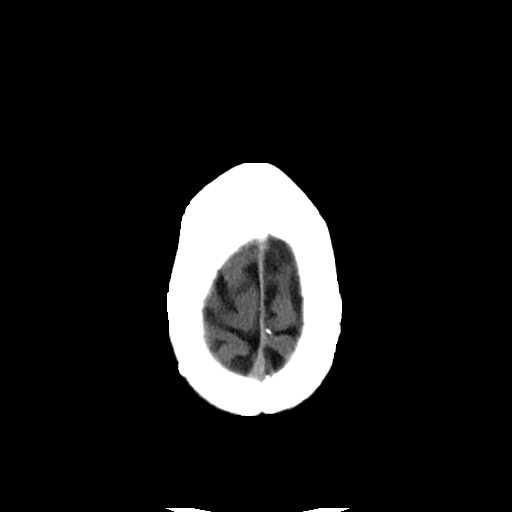
[im 29/32  brain]
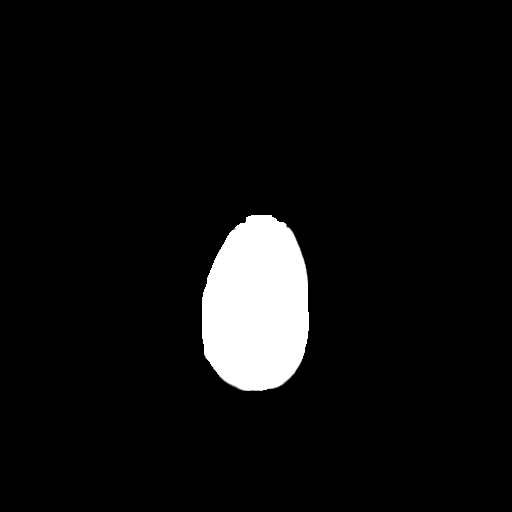
[im 29/32  bone]
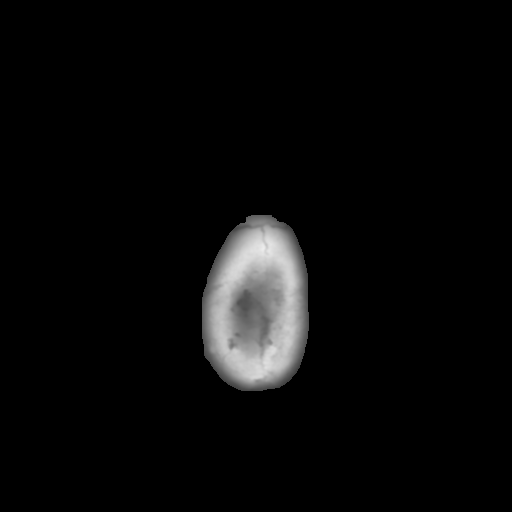

[Series 3: head w/o bone · axial · non-contrast · 0.50mm/px · z∈[+58,+103]mm · 3 of 32 slices shown]
[im 3/32  bone]
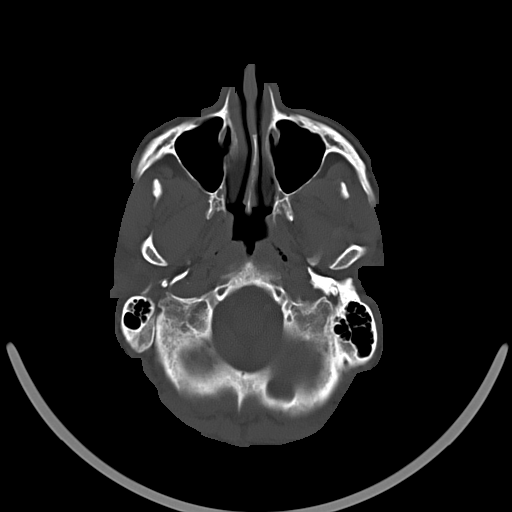
[im 7/32  bone]
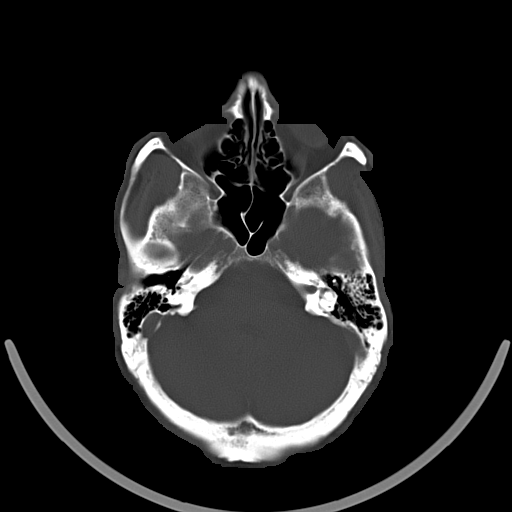
[im 12/32  bone]
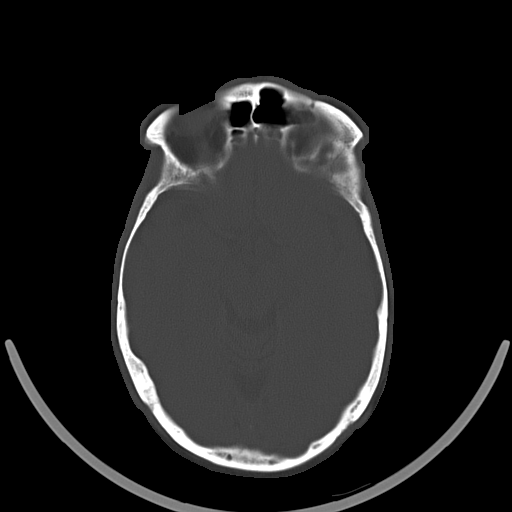

[16 of 30 positions shown; findings below may reference images not displayed]

FINDINGS: No acute intracranial abnormality is present.
Specifically, there is no evidence for acute infarct, hemorrhage,
mass, hydrocephalus, or extra-axial fluid collection.  The
paranasal sinuses and mastoid air cells are clear.  The globes and
orbits are intact.  The osseous skull is intact.
IMPRESSION: Negative CT of the head.

## 2012-07-09 ENCOUNTER — Other Ambulatory Visit: Payer: Self-pay | Admitting: Family Medicine

## 2012-07-25 ENCOUNTER — Telehealth: Payer: Self-pay | Admitting: Family Medicine

## 2012-07-25 DIAGNOSIS — Z0000 Encounter for general adult medical examination without abnormal findings: Secondary | ICD-10-CM

## 2012-07-25 MED ORDER — DIGOXIN 250 MCG PO TABS: 0.2500 mg | ORAL_TABLET | Freq: Every day | ORAL | Status: DC

## 2012-07-25 MED ORDER — QUINIDINE GLUCONATE ER 324 MG PO TBCR: 324.0000 mg | EXTENDED_RELEASE_TABLET | Freq: Every day | ORAL | Status: DC

## 2012-07-25 NOTE — Telephone Encounter (Signed)
Which labs should we order?

## 2012-07-25 NOTE — Telephone Encounter (Signed)
Pt has cpx scheduled 09/21/12.  He will run out of medication before then and needs 2 refills of lanoxin and quinidine called in.  Pt also states Dr.  Tawanna Cooler orders aditional labs due to him being on these medications.  Please enter orders for these labs prior to pt's appt for cpx labs on 09/14/12.

## 2012-07-28 ENCOUNTER — Other Ambulatory Visit: Payer: Self-pay | Admitting: *Deleted

## 2012-07-28 MED ORDER — QUINIDINE GLUCONATE ER 324 MG PO TBCR: 324.0000 mg | EXTENDED_RELEASE_TABLET | Freq: Every day | ORAL | Status: DC

## 2012-09-14 ENCOUNTER — Other Ambulatory Visit (INDEPENDENT_AMBULATORY_CARE_PROVIDER_SITE_OTHER): Payer: 59

## 2012-09-14 DIAGNOSIS — Z0000 Encounter for general adult medical examination without abnormal findings: Secondary | ICD-10-CM

## 2012-09-14 LAB — CBC WITH DIFFERENTIAL/PLATELET
Basophils Absolute: 0 10*3/uL (ref 0.0–0.1)
Basophils Relative: 0.2 % (ref 0.0–3.0)
Eosinophils Absolute: 0.1 10*3/uL (ref 0.0–0.7)
HCT: 43.6 % (ref 39.0–52.0)
Hemoglobin: 14.6 g/dL (ref 13.0–17.0)
Lymphocytes Relative: 26.3 % (ref 12.0–46.0)
Lymphs Abs: 1.3 10*3/uL (ref 0.7–4.0)
MCHC: 33.5 g/dL (ref 30.0–36.0)
MCV: 85.3 fl (ref 78.0–100.0)
Monocytes Absolute: 0.4 10*3/uL (ref 0.1–1.0)
Neutro Abs: 3.2 10*3/uL (ref 1.4–7.7)
RBC: 5.11 Mil/uL (ref 4.22–5.81)
RDW: 13.5 % (ref 11.5–14.6)

## 2012-09-14 LAB — BASIC METABOLIC PANEL
CO2: 31 mEq/L (ref 19–32)
Calcium: 9.1 mg/dL (ref 8.4–10.5)
Chloride: 100 mEq/L (ref 96–112)
Glucose, Bld: 86 mg/dL (ref 70–99)
Sodium: 137 mEq/L (ref 135–145)

## 2012-09-14 LAB — POCT URINALYSIS DIPSTICK
Blood, UA: NEGATIVE
Ketones, UA: NEGATIVE
Spec Grav, UA: >=1.03
Urobilinogen, UA: 0.2

## 2012-09-14 LAB — LIPID PANEL: Total CHOL/HDL Ratio: 4

## 2012-09-14 LAB — HEPATIC FUNCTION PANEL
ALT: 26 U/L (ref 0–53)
AST: 22 U/L (ref 0–37)
Bilirubin, Direct: 0.1 mg/dL (ref 0.0–0.3)
Total Bilirubin: 0.9 mg/dL (ref 0.3–1.2)

## 2012-09-14 LAB — TSH: TSH: 2.86 u[IU]/mL (ref 0.35–5.50)

## 2012-09-15 ENCOUNTER — Telehealth: Payer: Self-pay | Admitting: *Deleted

## 2012-09-15 NOTE — Telephone Encounter (Signed)
PC re: Critical lab call from [facility], digoxin 2.8 Dr. Tawanna Cooler patient

## 2012-09-15 NOTE — Telephone Encounter (Signed)
Message left on work phone to call back, message left on personally identified home phone with detailed instructions.

## 2012-09-15 NOTE — Telephone Encounter (Signed)
Spoke with patient and he will cut back on his digoxin level.  He has an appointment scheduled next week with Dr Tawanna Cooler.

## 2012-09-15 NOTE — Telephone Encounter (Signed)
HOLD Digoxin for 3 days then resume Digoxin at half current dose (new dose would be 0.125 mg daily) until follow up with Dr Tawanna Cooler.

## 2012-09-21 ENCOUNTER — Encounter: Payer: Self-pay | Admitting: Family Medicine

## 2012-09-21 ENCOUNTER — Ambulatory Visit (INDEPENDENT_AMBULATORY_CARE_PROVIDER_SITE_OTHER): Payer: 59 | Admitting: Family Medicine

## 2012-09-21 VITALS — BP 130/90 | Temp 98.6°F | Ht 71.5 in | Wt 201.0 lb

## 2012-09-21 DIAGNOSIS — G4733 Obstructive sleep apnea (adult) (pediatric): Secondary | ICD-10-CM

## 2012-09-21 DIAGNOSIS — Z23 Encounter for immunization: Secondary | ICD-10-CM

## 2012-09-21 DIAGNOSIS — I4891 Unspecified atrial fibrillation: Secondary | ICD-10-CM

## 2012-09-21 MED ORDER — LORAZEPAM 0.5 MG PO TABS: 0.5000 mg | ORAL_TABLET | ORAL | Status: DC

## 2012-09-21 MED ORDER — DIGOXIN 250 MCG PO TABS: ORAL_TABLET | ORAL | Status: DC

## 2012-09-21 MED ORDER — QUINIDINE GLUCONATE ER 324 MG PO TBCR: 324.0000 mg | EXTENDED_RELEASE_TABLET | Freq: Every day | ORAL | Status: DC

## 2012-09-21 NOTE — Patient Instructions (Signed)
Continue your current medications  I will call you tomorrow about your lab work  Followup in 1 year sooner if any problems

## 2012-09-21 NOTE — Progress Notes (Signed)
  Subjective:    Patient ID: Jeff Gonzales, male    DOB: 02-27-1961, 52 y.o.   MRN: [id number]  HPI [name] is a 52 year old male nonsmoker who comes in today for general physical examination because of a long-standing history of atrial fibrillation  I have followed him for about 30 years. He had an episode back then of A. fib and was placed on Lanoxin 0.25 daily along with clonidine 324 mg 5 times daily. He's done well over the past 30 years. We have talked with cardiology before about switching to other medications however that would require a hospitalization and they always felt that since he is doing well just continue on the current medication.  His pre-physical labs showed his dig level of 2.8. He cut his dig level from 0.25-1.25 last Friday.  He feels well no chest pain shortness of breath or rapid heart rate  Vaccinations up-to-date except these do a tetanus booster   Review of Systems  Constitutional: Negative.   HENT: Negative.   Eyes: Negative.   Respiratory: Negative.   Cardiovascular: Negative.   Gastrointestinal: Negative.   Genitourinary: Negative.   Musculoskeletal: Negative.   Skin: Negative.   Neurological: Negative.   Hematological: Negative.   Psychiatric/Behavioral: Negative.        Objective:   Physical Exam  Constitutional: He is oriented to person, place, and time. He appears well-developed and well-nourished.  HENT:  Head: Normocephalic and atraumatic.  Right Ear: External ear normal.  Left Ear: External ear normal.  Nose: Nose normal.  Mouth/Throat: Oropharynx is clear and moist.  Eyes: Conjunctivae normal and EOM are normal. Pupils are equal, round, and reactive to light.  Neck: Normal range of motion. Neck supple. No JVD present. No tracheal deviation present. No thyromegaly present.  Cardiovascular: Normal rate, regular rhythm, normal heart sounds and intact distal pulses.  Exam reveals no gallop and no friction rub.   No murmur  heard. Pulmonary/Chest: Effort normal and breath sounds normal. No stridor. No respiratory distress. He has no wheezes. He has no rales. He exhibits no tenderness.  Abdominal: Soft. Bowel sounds are normal. He exhibits no distension and no mass. There is no tenderness. There is no rebound and no guarding.  Genitourinary: Rectum normal, prostate normal and penis normal. Guaiac negative stool. No penile tenderness.  Musculoskeletal: Normal range of motion. He exhibits no edema and no tenderness.  Lymphadenopathy:    He has no cervical adenopathy.  Neurological: He is alert and oriented to person, place, and time. He has normal reflexes. No cranial nerve deficit. He exhibits normal muscle tone.  Skin: Skin is warm and dry. No rash noted. No erythema. No pallor.       Total body skin performed he does have light skin and light eyes first degree relative with melanoma,,,,,, all lesions appear normal sebaceous cyst mid back removed with a sterile needle  Psychiatric: He has a normal mood and affect. His behavior is normal. Judgment and thought content normal.          Assessment & Plan:  Healthy male  History of A. fib continue dig and quinidine recheck dig level

## 2012-09-28 ENCOUNTER — Telehealth: Payer: Self-pay | Admitting: *Deleted

## 2012-09-28 ENCOUNTER — Other Ambulatory Visit: Payer: Self-pay | Admitting: Family Medicine

## 2012-09-28 DIAGNOSIS — I4891 Unspecified atrial fibrillation: Secondary | ICD-10-CM

## 2012-09-28 MED ORDER — DIGOXIN 250 MCG PO TABS: 0.2500 mg | ORAL_TABLET | Freq: Every day | ORAL | Status: DC

## 2012-09-28 NOTE — Telephone Encounter (Signed)
Patient is aware of lab results and an appointment made.

## 2012-09-28 NOTE — Telephone Encounter (Signed)
Message copied by Trenton Gammon on Thu Sep 28, 2012  5:00 PM ------      Message from: TODD, JEFFREY A      Created: Mon Sep 25, 2012 10:11 AM       Fleet Contras please call,,,,,,,,,,, his current dig level is 1.5 which is normal,,,,,, tell him to do what he is currently doing and give followup dig level in one month

## 2012-10-24 ENCOUNTER — Other Ambulatory Visit: Payer: 59

## 2012-10-24 DIAGNOSIS — I4891 Unspecified atrial fibrillation: Secondary | ICD-10-CM

## 2012-10-25 ENCOUNTER — Encounter: Payer: Self-pay | Admitting: *Deleted

## 2012-10-27 ENCOUNTER — Telehealth: Payer: Self-pay | Admitting: *Deleted

## 2012-10-27 DIAGNOSIS — I4891 Unspecified atrial fibrillation: Secondary | ICD-10-CM

## 2012-10-27 NOTE — Telephone Encounter (Signed)
Patient requests his prescription for quinidine to be written as 2 in the AM, 2 in the afternoon, and 2 in the evening giving him 180 a month because insurance will not cover #500 and he is running out of medication.  Is this okay to give?

## 2012-10-30 MED ORDER — QUINIDINE GLUCONATE ER 324 MG PO TBCR: EXTENDED_RELEASE_TABLET | ORAL | Status: DC

## 2012-10-30 NOTE — Telephone Encounter (Signed)
K

## 2012-10-30 NOTE — Telephone Encounter (Signed)
New prescription sent. Left message on machine

## 2013-04-16 IMAGING — CR DG ANKLE COMPLETE 3+V*R*
3 series · 3 of 3 positions shown · non-contrast
Comparison: None.

CLINICAL DATA: Pain

RIGHT ANKLE - COMPLETE 3+ VIEW

[t ankle joint ap right]
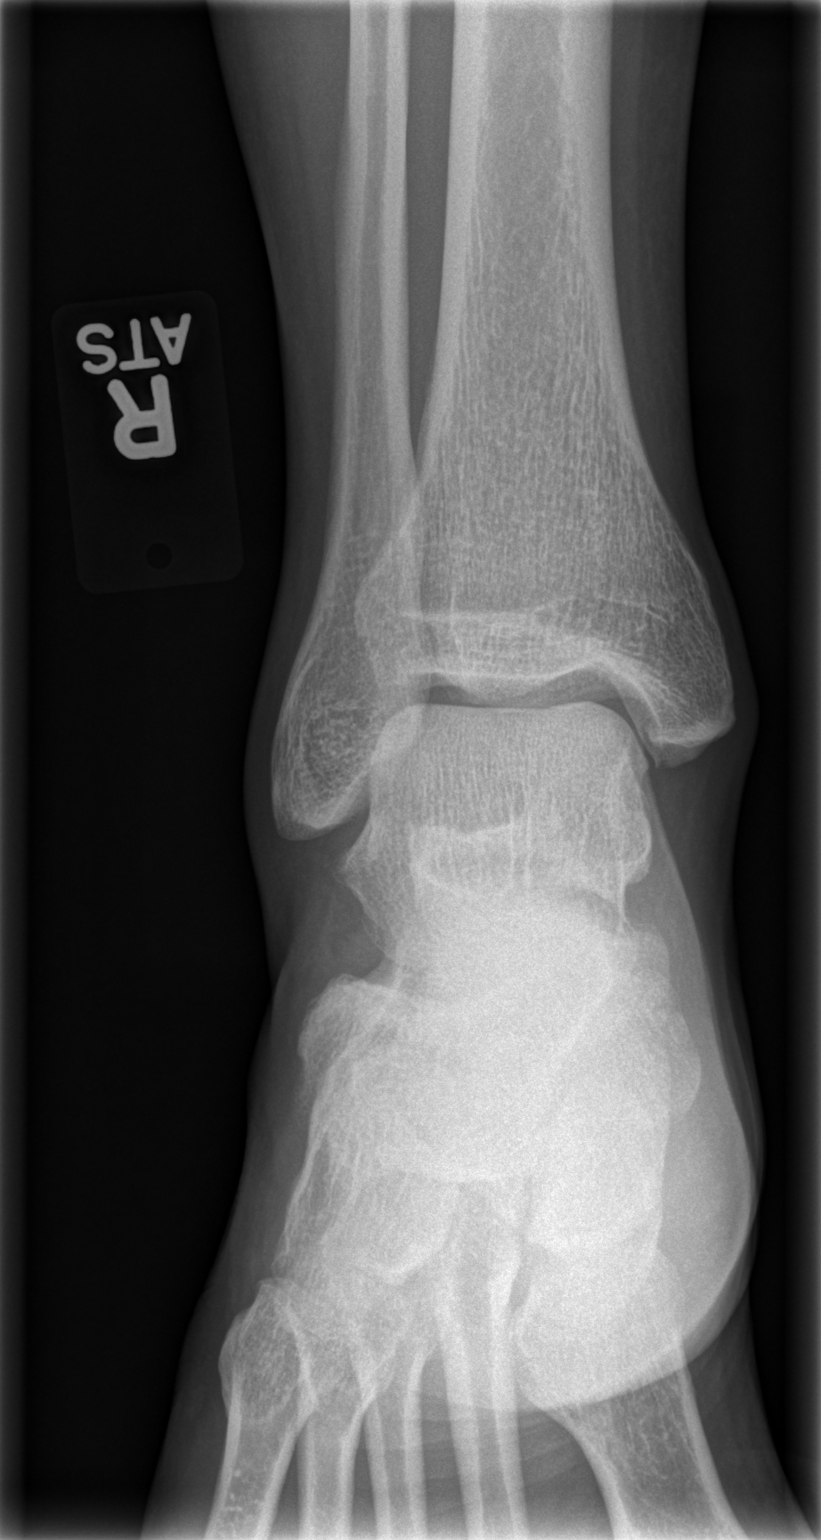

[t ankle joint oblique right]
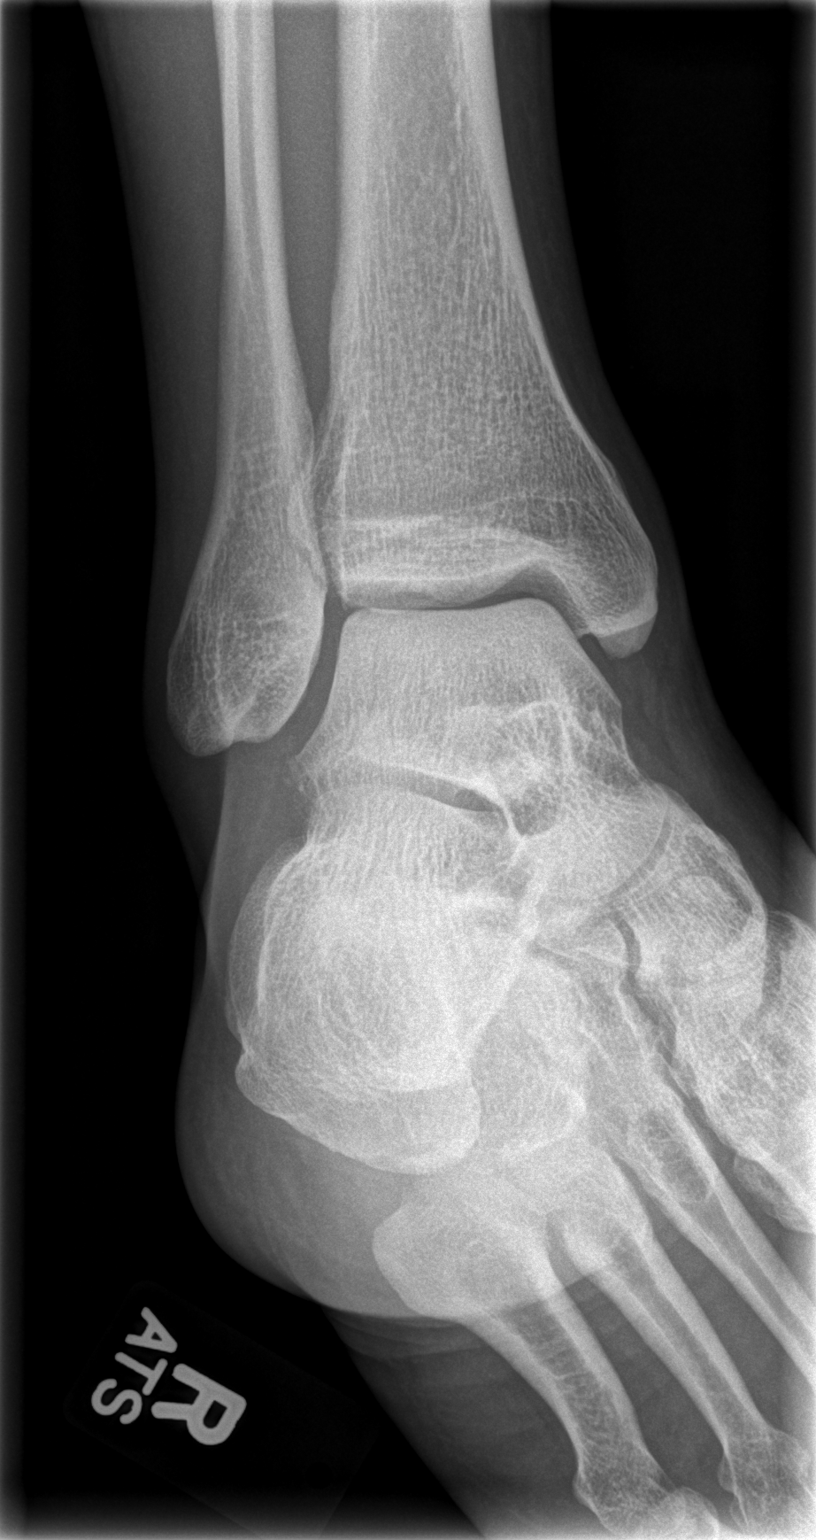

[t ankle joint lat right]
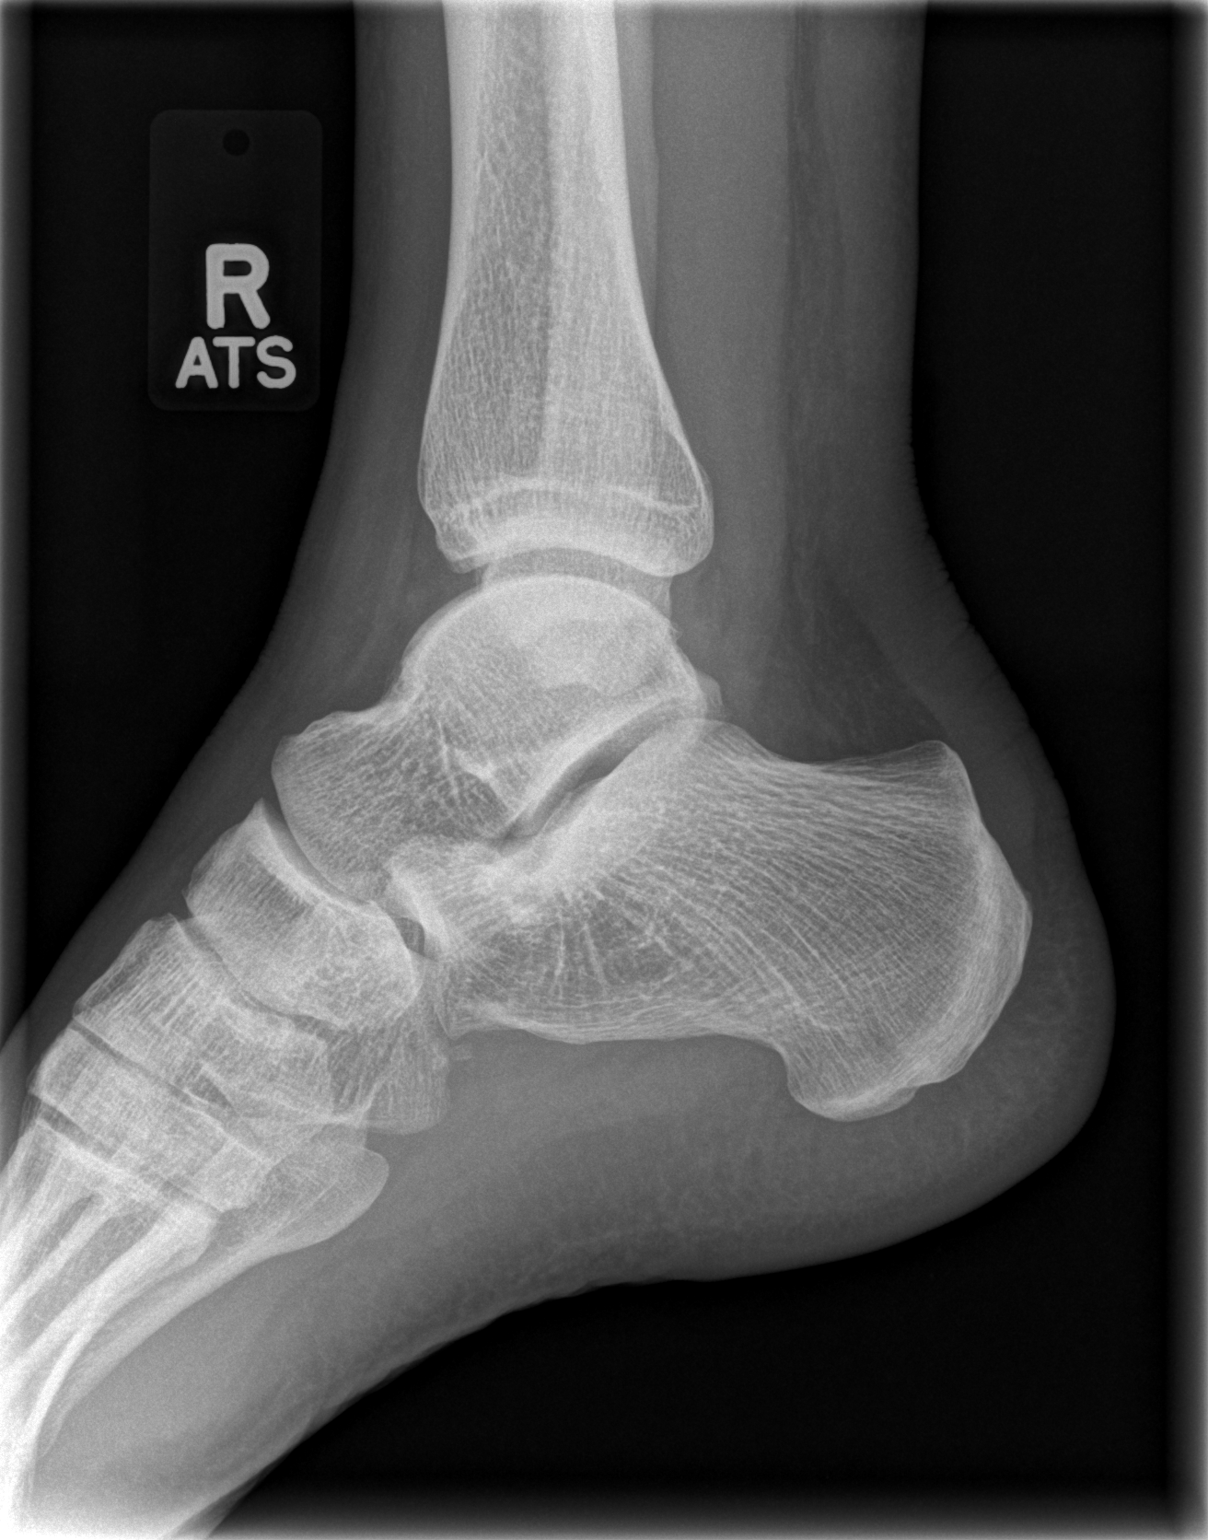

[3 of 3 positions shown; findings below may reference images not displayed]

FINDINGS: No acute fracture and no dislocation.  Unremarkable soft
tissues.
IMPRESSION: No acute bony pathology.

## 2013-07-12 ENCOUNTER — Other Ambulatory Visit: Payer: Self-pay

## 2013-09-06 DIAGNOSIS — K5792 Diverticulitis of intestine, part unspecified, without perforation or abscess without bleeding: Secondary | ICD-10-CM

## 2013-09-06 HISTORY — DX: Diverticulitis of intestine, part unspecified, without perforation or abscess without bleeding: K57.92

## 2013-10-01 ENCOUNTER — Telehealth: Payer: Self-pay | Admitting: Family Medicine

## 2013-10-01 DIAGNOSIS — I4891 Unspecified atrial fibrillation: Secondary | ICD-10-CM

## 2013-10-01 NOTE — Telephone Encounter (Signed)
Pt is sch for cpx on 12-03-13. Pt would like to have digoxin and quinidine therapeutic blood work levels check at cpx labs on 11-26-13

## 2013-10-07 ENCOUNTER — Other Ambulatory Visit: Payer: Self-pay | Admitting: Family Medicine

## 2013-11-07 ENCOUNTER — Other Ambulatory Visit: Payer: Self-pay | Admitting: Family Medicine

## 2013-11-26 ENCOUNTER — Other Ambulatory Visit (INDEPENDENT_AMBULATORY_CARE_PROVIDER_SITE_OTHER): Payer: 59

## 2013-11-26 DIAGNOSIS — Z0000 Encounter for general adult medical examination without abnormal findings: Secondary | ICD-10-CM

## 2013-11-26 DIAGNOSIS — I4891 Unspecified atrial fibrillation: Secondary | ICD-10-CM

## 2013-11-26 LAB — CBC WITH DIFFERENTIAL/PLATELET
BASOS ABS: 0 10*3/uL (ref 0.0–0.1)
Basophils Relative: 0.5 % (ref 0.0–3.0)
Eosinophils Absolute: 0.1 10*3/uL (ref 0.0–0.7)
Eosinophils Relative: 3.4 % (ref 0.0–5.0)
HCT: 44.1 % (ref 39.0–52.0)
HEMOGLOBIN: 14.7 g/dL (ref 13.0–17.0)
LYMPHS PCT: 26 % (ref 12.0–46.0)
Lymphs Abs: 1.1 10*3/uL (ref 0.7–4.0)
MCHC: 33.3 g/dL (ref 30.0–36.0)
MCV: 87.4 fl (ref 78.0–100.0)
MONOS PCT: 7.8 % (ref 3.0–12.0)
Monocytes Absolute: 0.3 10*3/uL (ref 0.1–1.0)
NEUTROS ABS: 2.6 10*3/uL (ref 1.4–7.7)
NEUTROS PCT: 62.3 % (ref 43.0–77.0)
PLATELETS: 189 10*3/uL (ref 150.0–400.0)
RBC: 5.05 Mil/uL (ref 4.22–5.81)
RDW: 13 % (ref 11.5–14.6)
WBC: 4.1 10*3/uL — ABNORMAL LOW (ref 4.5–10.5)

## 2013-11-26 LAB — BASIC METABOLIC PANEL
BUN: 19 mg/dL (ref 6–23)
CALCIUM: 9.3 mg/dL (ref 8.4–10.5)
CO2: 31 mEq/L (ref 19–32)
Chloride: 104 mEq/L (ref 96–112)
Creatinine, Ser: 1.2 mg/dL (ref 0.4–1.5)
GFR: 64.4 mL/min (ref >60.00)
GLUCOSE: 87 mg/dL (ref 70–99)
Potassium: 4.2 mEq/L (ref 3.5–5.1)
SODIUM: 140 meq/L (ref 135–145)

## 2013-11-26 LAB — HEPATIC FUNCTION PANEL
ALK PHOS: 55 U/L (ref 39–117)
ALT: 22 U/L (ref 0–53)
AST: 20 U/L (ref 0–37)
Albumin: 4.3 g/dL (ref 3.5–5.2)
BILIRUBIN DIRECT: 0.2 mg/dL (ref 0.0–0.3)
BILIRUBIN TOTAL: 0.6 mg/dL (ref 0.3–1.2)
Total Protein: 6.6 g/dL (ref 6.0–8.3)

## 2013-11-26 LAB — POCT URINALYSIS DIPSTICK
Bilirubin, UA: NEGATIVE
Blood, UA: NEGATIVE
GLUCOSE UA: NEGATIVE
Ketones, UA: NEGATIVE
Leukocytes, UA: NEGATIVE
Nitrite, UA: NEGATIVE
Spec Grav, UA: >=1.03
UROBILINOGEN UA: 0.2
pH, UA: 6.5

## 2013-11-26 LAB — LIPID PANEL
CHOLESTEROL: 148 mg/dL (ref 0–200)
HDL: 40 mg/dL (ref >39.00)
LDL Cholesterol: 90 mg/dL (ref 0–99)
TRIGLYCERIDES: 90 mg/dL (ref 0.0–149.0)
Total CHOL/HDL Ratio: 4
VLDL: 18 mg/dL (ref 0.0–40.0)

## 2013-11-26 LAB — PSA: PSA: 0.81 ng/mL (ref 0.10–4.00)

## 2013-11-26 LAB — TSH: TSH: 2.4 u[IU]/mL (ref 0.35–5.50)

## 2013-11-27 LAB — DIGOXIN LEVEL: Digoxin Level: 1.5 ng/mL (ref 0.8–2.0)

## 2013-11-28 LAB — QUINIDINE LEVEL: Quinidine: 1.6 mg/L — ABNORMAL LOW (ref 2.0–5.0)

## 2013-12-03 ENCOUNTER — Encounter: Payer: 59 | Admitting: Family Medicine

## 2013-12-10 ENCOUNTER — Telehealth: Payer: Self-pay | Admitting: Family Medicine

## 2013-12-10 MED ORDER — QUINIDINE GLUCONATE ER 324 MG PO TBCR: EXTENDED_RELEASE_TABLET | ORAL | Status: DC

## 2013-12-10 NOTE — Telephone Encounter (Signed)
Refill sent.

## 2013-12-10 NOTE — Telephone Encounter (Signed)
[facility] [place] - [id number] Is requesting re-fill on quiniDINE gluconate 324 MG CR tablet

## 2013-12-13 ENCOUNTER — Encounter: Payer: Self-pay | Admitting: Family Medicine

## 2013-12-13 ENCOUNTER — Ambulatory Visit (INDEPENDENT_AMBULATORY_CARE_PROVIDER_SITE_OTHER): Payer: 59 | Admitting: Family Medicine

## 2013-12-13 VITALS — BP 140/90 | Temp 98.4°F | Ht 71.25 in | Wt 200.0 lb

## 2013-12-13 DIAGNOSIS — G4733 Obstructive sleep apnea (adult) (pediatric): Secondary | ICD-10-CM

## 2013-12-13 DIAGNOSIS — R0609 Other forms of dyspnea: Secondary | ICD-10-CM

## 2013-12-13 DIAGNOSIS — Z1211 Encounter for screening for malignant neoplasm of colon: Secondary | ICD-10-CM

## 2013-12-13 DIAGNOSIS — R0989 Other specified symptoms and signs involving the circulatory and respiratory systems: Secondary | ICD-10-CM

## 2013-12-13 DIAGNOSIS — I4891 Unspecified atrial fibrillation: Secondary | ICD-10-CM

## 2013-12-13 DIAGNOSIS — R42 Dizziness and giddiness: Secondary | ICD-10-CM

## 2013-12-13 MED ORDER — DIGOXIN 250 MCG PO TABS: ORAL_TABLET | ORAL | Status: DC

## 2013-12-13 MED ORDER — LORAZEPAM 0.5 MG PO TABS: 0.5000 mg | ORAL_TABLET | ORAL | Status: DC

## 2013-12-13 MED ORDER — QUINIDINE GLUCONATE ER 324 MG PO TBCR: EXTENDED_RELEASE_TABLET | ORAL | Status: DC

## 2013-12-13 NOTE — Patient Instructions (Signed)
Continue current medications  For knee and/or joint or elbow pain.......Marland Kitchen. Motrin 60 mg twice daily with food elevation ice  If you would like to see pulmonary for evaluation of snoring and possible sleep apnea we can certainly set that up for you  Continue good health habits  Followup in 1 year sooner if any problems

## 2013-12-13 NOTE — Progress Notes (Signed)
   Subjective:    Patient ID: Jeff LibmanEric A Gonzales, male    DOB: 07/06/1961, 53 y.o.   MRN: [id number]  HPI [name] is a 53 year old male divorced nonsmoker who comes in today for general physical examination because of a history of atrial fibrillation  He was diagnosed many years ago with A. fib and since that time has been on a combination of dig and quinidine. We discussed going off the medication however he had to be hospitalized and monitored. At one point we did see cardiology that was the recommendation average content just to take his medicine because he feels fine and I think that's reasonable  He gets routine eye care, dental care, never had a colonoscopy, has occasional BPV, right knee swelling that went away, soreness in his left elbow snoring and question sleep apnea  Vaccinations up-to-date    Review of Systems  Constitutional: Negative.   HENT: Negative.   Eyes: Negative.   Respiratory: Negative.   Cardiovascular: Negative.   Gastrointestinal: Negative.   Genitourinary: Negative.   Musculoskeletal: Negative.   Skin: Negative.   Neurological: Negative.   Psychiatric/Behavioral: Negative.        Objective:   Physical Exam  Nursing note and vitals reviewed. Constitutional: He is oriented to person, place, and time. He appears well-developed and well-nourished.  HENT:  Head: Normocephalic and atraumatic.  Right Ear: External ear normal.  Left Ear: External ear normal.  Nose: Nose normal.  Mouth/Throat: Oropharynx is clear and moist.  Eyes: Conjunctivae and EOM are normal. Pupils are equal, round, and reactive to light.  Neck: Normal range of motion. Neck supple. No JVD present. No tracheal deviation present. No thyromegaly present.  Cardiovascular: Normal rate, regular rhythm, normal heart sounds and intact distal pulses.  Exam reveals no gallop and no friction rub.   No murmur heard. No carotid artery bruits peripheral pulses 2+ and symmetrical  Pulmonary/Chest: Effort  normal and breath sounds normal. No stridor. No respiratory distress. He has no wheezes. He has no rales. He exhibits no tenderness.  Abdominal: Soft. Bowel sounds are normal. He exhibits no distension and no mass. There is no tenderness. There is no rebound and no guarding.  Genitourinary: Rectum normal, prostate normal and penis normal. Guaiac negative stool. No penile tenderness.  Musculoskeletal: Normal range of motion. He exhibits no edema and no tenderness.  Lymphadenopathy:    He has no cervical adenopathy.  Neurological: He is alert and oriented to person, place, and time. He has normal reflexes. No cranial nerve deficit. He exhibits normal muscle tone.  Skin: Skin is warm and dry. No rash noted. No erythema. No pallor.  Total body skin exam normal....... the new lesion is around his rectum its 4 mm pale slightly elevated no evidence of anything abnormal  Psychiatric: He has a normal mood and affect. His behavior is normal. Judgment and thought content normal.          Assessment & Plan:  Healthy male  History of A. fib continue dig and quinidine  BPV  Pain and swelling right knee resolve with symptomatic therapy  Tendinitis left elbow  History of snoring.... question sleep apnea,,,,, question pulmonary consult

## 2013-12-13 NOTE — Addendum Note (Signed)
Addended by: Kern ReapVEREEN, Rocket Gunderson B on: 12/13/2013 12:57 PM   Modules accepted: Orders

## 2013-12-13 NOTE — Progress Notes (Signed)
Pre visit review using our clinic review tool, if applicable. No additional management support is needed unless otherwise documented below in the visit note. 

## 2014-01-14 ENCOUNTER — Encounter: Payer: Self-pay | Admitting: Family Medicine

## 2014-03-17 ENCOUNTER — Encounter (HOSPITAL_COMMUNITY): Payer: Self-pay | Admitting: Emergency Medicine

## 2014-03-17 ENCOUNTER — Emergency Department (HOSPITAL_COMMUNITY)
Admission: EM | Admit: 2014-03-17 | Discharge: 2014-03-17 | Disposition: A | Discharge status: Home / Self Care | Payer: 59 | Attending: Emergency Medicine | Admitting: Emergency Medicine

## 2014-03-17 DIAGNOSIS — Z79899 Other long term (current) drug therapy: Secondary | ICD-10-CM | POA: Insufficient documentation

## 2014-03-17 DIAGNOSIS — I491 Atrial premature depolarization: Secondary | ICD-10-CM

## 2014-03-17 DIAGNOSIS — Z87891 Personal history of nicotine dependence: Secondary | ICD-10-CM | POA: Insufficient documentation

## 2014-03-17 DIAGNOSIS — Z872 Personal history of diseases of the skin and subcutaneous tissue: Secondary | ICD-10-CM | POA: Insufficient documentation

## 2014-03-17 DIAGNOSIS — Z8669 Personal history of other diseases of the nervous system and sense organs: Secondary | ICD-10-CM | POA: Insufficient documentation

## 2014-03-17 DIAGNOSIS — I4891 Unspecified atrial fibrillation: Secondary | ICD-10-CM | POA: Insufficient documentation

## 2014-03-17 LAB — BASIC METABOLIC PANEL
ANION GAP: 15 (ref 5–15)
BUN: 19 mg/dL (ref 6–23)
CO2: 27 mEq/L (ref 19–32)
Calcium: 9.5 mg/dL (ref 8.4–10.5)
Chloride: 101 mEq/L (ref 96–112)
Creatinine, Ser: 1.23 mg/dL (ref 0.50–1.35)
GFR calc Af Amer: 75 mL/min — ABNORMAL LOW (ref >90)
GFR calc non Af Amer: 64 mL/min — ABNORMAL LOW (ref >90)
Glucose, Bld: 108 mg/dL — ABNORMAL HIGH (ref 70–99)
Potassium: 3.9 mEq/L (ref 3.7–5.3)
Sodium: 143 mEq/L (ref 137–147)

## 2014-03-17 LAB — CBC WITH DIFFERENTIAL/PLATELET
BASOS PCT: 0 % (ref 0–1)
Basophils Absolute: 0 10*3/uL (ref 0.0–0.1)
EOS PCT: 1 % (ref 0–5)
Eosinophils Absolute: 0.1 10*3/uL (ref 0.0–0.7)
HEMATOCRIT: 44.5 % (ref 39.0–52.0)
HEMOGLOBIN: 15 g/dL (ref 13.0–17.0)
Lymphocytes Relative: 24 % (ref 12–46)
Lymphs Abs: 1.2 10*3/uL (ref 0.7–4.0)
MCH: 28.8 pg (ref 26.0–34.0)
MCHC: 33.7 g/dL (ref 30.0–36.0)
MCV: 85.4 fL (ref 78.0–100.0)
MONO ABS: 0.4 10*3/uL (ref 0.1–1.0)
MONOS PCT: 8 % (ref 3–12)
Neutro Abs: 3.2 10*3/uL (ref 1.7–7.7)
Neutrophils Relative %: 67 % (ref 43–77)
Platelets: 154 10*3/uL (ref 150–400)
RBC: 5.21 MIL/uL (ref 4.22–5.81)
RDW: 12.9 % (ref 11.5–15.5)
WBC: 4.8 10*3/uL (ref 4.0–10.5)

## 2014-03-17 LAB — TSH: TSH: 3.09 u[IU]/mL (ref 0.350–4.500)

## 2014-03-17 LAB — MAGNESIUM: Magnesium: 2.1 mg/dL (ref 1.5–2.5)

## 2014-03-17 LAB — DIGOXIN LEVEL: Digoxin Level: 1.7 ng/mL (ref 0.8–2.0)

## 2014-03-17 LAB — I-STAT TROPONIN, ED: TROPONIN I, POC: 0.01 ng/mL (ref 0.00–0.08)

## 2014-03-17 MED ORDER — METOPROLOL SUCCINATE ER 50 MG PO TB24
50.0000 mg | ORAL_TABLET | Freq: Once | ORAL | Status: AC
  Administered 2014-03-17: 50 mg via ORAL
  Filled 2014-03-17: qty 1

## 2014-03-17 MED ORDER — METOPROLOL SUCCINATE ER 25 MG PO TB24: 25.0000 mg | ORAL_TABLET | Freq: Every day | ORAL | Status: DC

## 2014-03-17 NOTE — ED Notes (Signed)
Pt states he has been experiencing palpitations since 0400, pt states this has happened before and it causes him to have any anxiety. Pt denies any CP or pain anywhere. Pt states he has been in A-Fib before. He states he is usually able to take 0.5 mg Ativan at home to calm himself down and regulate his heart but it was not effective this morning. Pt has had 1 mg ativan total today. Pt NAD at this time.

## 2014-03-17 NOTE — ED Provider Notes (Signed)
CSN: [id number]     Arrival date & time 03/17/14  0907 History   First MD Initiated Contact with Patient 03/17/14 [phone]     No chief complaint on file.    (Consider location/radiation/quality/duration/timing/severity/associated sxs/prior Treatment) HPI Comments: Patient presents to the emergency department with chief complaint of heart palpitations. States that he has a history of A. fib, and is currently taking digoxin and quinidine. He states that he has had A. fib for the past 30 years. He states that occasionally he notices "skipped beats." He states that this caused him to have anxiety and panic attacks. He states that he has been noticing more skipped beats than usual. He is tried taking Ativan for the anxiety with good relief, but states that his anxiety recurs as soon as he notices another skipped beat. He denies any chest pain or shortness of breath. Denies any nausea or vomiting. Denies any other health problems.  The history is provided by the patient. No language interpreter was used.    Past Medical History  Diagnosis Date  . AF (atrial fibrillation)   . HA (headache)   . Obstructive sleep apnea 2004    mild osa --AHI 7/hr.  . Vertigo   . Keratosis, actinic     head   Past Surgical History  Procedure Laterality Date  . Neck surgery    . Wisdom tooth extraction     Family History  Problem Relation Age of Onset  . Cancer Mother   . Melanoma Other    History  Substance Use Topics  . Smoking status: Former Smoker -- 1.50 packs/day    Quit date: 08/16/1983  . Smokeless tobacco: Not on file  . Alcohol Use: No    Review of Systems  All other systems reviewed and are negative.     Allergies  Review of patient's allergies indicates no known allergies.  Home Medications   Prior to Admission medications   Medication Sig Start Date End Date Taking? Authorizing Provider  digoxin (DIGOX) 0.25 MG tablet TAKE 1 TABLET ONCE DAILY. 12/13/13   Roderick Pee, MD   LORazepam (ATIVAN) 0.5 MG tablet Take 1 tablet (0.5 mg total) by mouth as directed. take one tablet by mouth two times a day 12/13/13   Roderick Pee, MD  quiniDINE gluconate 324 MG CR tablet TAKE 2 TABLETS IN THE MORNING, 2 TABLETS IN THE AFTERNOON, AND 2 TABLETS IN THE EVENING 12/13/13   Roderick Pee, MD   BP 132/88  Pulse 75  Temp(Src) 97.9 F (36.6 C) (Oral)  Resp 16  Ht 5\' 11"  (1.803 m)  Wt 200 lb (90.719 kg)  BMI 27.91 kg/m2  SpO2 100% Physical Exam  Nursing note and vitals reviewed. Constitutional: He is oriented to person, place, and time. He appears well-developed and well-nourished.  HENT:  Head: Normocephalic and atraumatic.  Eyes: Conjunctivae and EOM are normal. Pupils are equal, round, and reactive to light. Right eye exhibits no discharge. Left eye exhibits no discharge. No scleral icterus.  Neck: Normal range of motion. Neck supple. No JVD present.  Cardiovascular: Normal rate, regular rhythm and normal heart sounds.  Exam reveals no gallop and no friction rub.   No murmur heard. Pulmonary/Chest: Effort normal and breath sounds normal. No respiratory distress. He has no wheezes. He has no rales. He exhibits no tenderness.  Abdominal: Soft. He exhibits no distension and no mass. There is no tenderness. There is no rebound and no guarding.  Musculoskeletal: Normal range of  motion. He exhibits no edema and no tenderness.  Neurological: He is alert and oriented to person, place, and time.  Skin: Skin is warm and dry.  Psychiatric: He has a normal mood and affect. His behavior is normal. Judgment and thought content normal.    ED Course  Procedures (including critical care time) Results for orders placed during the hospital encounter of 03/17/14  CBC WITH DIFFERENTIAL      Result Value Ref Range   WBC 4.8  4.0 - 10.5 K/uL   RBC 5.21  4.22 - 5.81 MIL/uL   Hemoglobin 15.0  13.0 - 17.0 g/dL   HCT 78.2  95.6 - 21.3 %   MCV 85.4  78.0 - 100.0 fL   MCH 28.8  26.0 - 34.0  pg   MCHC 33.7  30.0 - 36.0 g/dL   RDW 08.6  57.8 - 46.9 %   Platelets 154  150 - 400 K/uL   Neutrophils Relative % 67  43 - 77 %   Neutro Abs 3.2  1.7 - 7.7 K/uL   Lymphocytes Relative 24  12 - 46 %   Lymphs Abs 1.2  0.7 - 4.0 K/uL   Monocytes Relative 8  3 - 12 %   Monocytes Absolute 0.4  0.1 - 1.0 K/uL   Eosinophils Relative 1  0 - 5 %   Eosinophils Absolute 0.1  0.0 - 0.7 K/uL   Basophils Relative 0  0 - 1 %   Basophils Absolute 0.0  0.0 - 0.1 K/uL  BASIC METABOLIC PANEL      Result Value Ref Range   Sodium 143  137 - 147 mEq/L   Potassium 3.9  3.7 - 5.3 mEq/L   Chloride 101  96 - 112 mEq/L   CO2 27  19 - 32 mEq/L   Glucose, Bld 108 (*) 70 - 99 mg/dL   BUN 19  6 - 23 mg/dL   Creatinine, Ser 6.29  0.50 - 1.35 mg/dL   Calcium 9.5  8.4 - 52.8 mg/dL   GFR calc non Af Amer 64 (*) >90 mL/min   GFR calc Af Amer 75 (*) >90 mL/min   Anion gap 15  5 - 15  DIGOXIN LEVEL      Result Value Ref Range   Digoxin Level 1.7  0.8 - 2.0 ng/mL  MAGNESIUM      Result Value Ref Range   Magnesium 2.1  1.5 - 2.5 mg/dL  I-STAT TROPOININ, ED      Result Value Ref Range   Troponin i, poc 0.01  0.00 - 0.08 ng/mL   Comment 3            No results found.   Imaging Review No results found.   EKG Interpretation   Date/Time:  Sunday March 17 2014 09:11:46 EDT Ventricular Rate:  78 PR Interval:  196 QRS Duration: 98 QT Interval:  506 QTC Calculation: 576 R Axis:   -98 Text Interpretation:  Normal sinus rhythm Right superior axis deviation  Pulmonary disease pattern Incomplete right bundle branch block Right  ventricular hypertrophy with repolarization abnormality Cannot rule out  Inferior infarct , age undetermined Prolonged QT Abnormal ECG Confirmed by  Fayrene Fearing  MD, MARK ([id number]) on 03/17/2014 9:34:39 AM      MDM   Final diagnoses:  PAC (premature atrial contraction)   Patient with hx of a-fib.  Taking digoxin and quinidine.  Concerned that he is back in a-fib.  Discussed with Dr.  Fayrene Fearing.  Will check levels and monitor.  Anticipate discharge with PCP/Cardiology follow-up.  Possibly needs a Holter monitor.  Patient seen by and discussed with Dr. Fayrene FearingJames.    Roxy Horsemanobert Rickiya Picariello, PA-C 03/17/14 1300

## 2014-03-17 NOTE — Discharge Instructions (Signed)
Your palpitations are caused by premature atrial and ventricular beats. No sign of heart attack or cardiac injury on evaluation today. No Atrial fibrillation noted today. Prescription for beta blocker written. Count your pulse daily. If your pulse falls below 60, discontinue the beta blocker and recheck. Cardiology followup with [facility] cardiology at the number above. Ativan as needed at night for anxiety or sleep. Avoid caffeine.  Premature Beats A premature beat is an extra heartbeat that happens earlier than normal. Premature beats are called premature atrial contractions (PACs) or premature ventricular contractions (PVCs) depending on the area of the heart where they start. CAUSES  Premature beats may be brought on by a variety of factors including:  Emotional stress.  Lack of sleep.  Caffeine.  Asthma medicines.  Stimulants.  Herbal teas.  Dietary supplements.  Alcohol. In most cases, premature beats are not dangerous and are not a sign of serious heart disease. Most patients evaluated for premature beats have completely normal heart function. Rarely, premature beats may be a sign of more significant heart problems or medical illness. SYMPTOMS  Premature beats may cause palpitations. This means you feel like your heart is skipping a beat or beating harder than usual. Sometimes, slight chest pain occurs with premature beats, lasting only a few seconds. This pain has been described as a "flopping" feeling inside the chest. In many cases, premature beats do not cause any symptoms and they are only detected when an electrocardiography test (EKG) or heart monitoring is performed. DIAGNOSIS  Your caregiver may run some tests to evaluate your heart such as an EKG or echocardiography. You may need to wear a portable heart monitor for several days to record the electrical activity of your heart. Blood testing may also be performed to check your electrolytes and thyroid  function. TREATMENT  Premature beats usually go away with rest. If the problem continues, your caregiver will determine a treatment plan for you.  HOME CARE INSTRUCTIONS  Get plenty of rest over the next few days until your symptoms improve.  Avoid coffee, tea, alcohol, and soda (pop, cola).  Do not smoke. SEEK MEDICAL CARE IF:  Your symptoms continue after 1 to 2 days of rest.  You have new symptoms, such as chest pain or trouble breathing. SEEK IMMEDIATE MEDICAL CARE IF:  You have severe chest pain or abdominal pain.  You have pain that radiates into the neck, arm, or jaw.  You faint or have extreme weakness.  You have shortness of breath.  Your heartbeat races for more than 5 seconds. MAKE SURE YOU:  Understand these instructions.  Will watch your condition.  Will get help right away if you are not doing well or get worse. Document Released: 09/30/2004 Document Revised: 11/15/2011 Document Reviewed: 04/26/2011 [facility] Patient Information 2015 [place]. This information is not intended to replace advice given to you by your health care provider. Make sure you discuss any questions you have with your health care provider.  Holter Monitoring A Holter monitor is a small device with electrodes (small sticky patches) that attach to your chest. It records the electrical activity of your heart and is worn continuously for 24-48 hours.  A HOLTER MONITOR IS USED TO  Detect heart problems such as:  Heart arrhythmia. Is an abnormal or irregular heartbeat. With some heart arrhythmias, you may not feel or know that you have an irregular heart rhythm.  Palpitations, such as feeling your heart racing or fluttering. It is possible to have heart palpitations and  not have a heart arrhythmia.  A heart rhythm that is too slow or too fast.  If you have problems fainting, near fainting or feeling light-headed, a Holter monitor may be worn to see if your heart is the cause. HOLTER  MONITOR PREPARATION   Electrodes will be attached to the skin on your chest.  If you have hair on your chest, small areas may have to be shaved. This is done to help the patches stick better and make the recording more accurate.  The electrodes are attached by wires to the Holter monitor. The Holter monitor clips to your clothing. You will wear the monitor at all times, even while exercising and sleeping. HOME CARE INSTRUCTIONS   Wear your monitor at all times.  The wires and the monitor must stay dry. Do not get the monitor wet.  Do not bathe, swim or use a hot tub with it on.  You may do a "sponge" bath while you have the monitor on.  Keep your skin clean, do not put body lotion or moisturizer on your chest.  It's possible that your skin under the electrodes could become irritated. To keep this from happening, you may put the electrodes in slightly different places on your chest.  Your caregiver will also ask you to keep a diary of your activities, such as walking or doing chores. Be sure to note what you are doing if you experience heart symptoms such as palpitations. This will help your caregiver determine what might be contributing to your symptoms. The information stored in your monitor will be reviewed by your caregiver alongside your diary entries.  Make sure the monitor is safely clipped to your clothing or in a location close to your body that your caregiver recommends.  The monitor and electrodes are removed when the test is over. Return the monitor as directed.  Be sure to follow up with your caregiver and discuss your Holter monitor results. SEEK IMMEDIATE MEDICAL CARE IF:  You faint or feel lightheaded.  You have trouble breathing.  You get pain in your chest, upper arm or jaw.  You feel sick to your stomach and your skin is pale, cool, or damp.  You think something is wrong with the way your heart is beating. MAKE SURE YOU:   Understand these  instructions.  Will watch your condition.  Will get help right away if you are not doing well or get worse. Document Released: 05/21/2004 Document Revised: 11/15/2011 Document Reviewed: 10/03/2008 [facility] Patient Information 2015 [place], [place]. This information is not intended to replace advice given to you by your health care provider. Make sure you discuss any questions you have with your health care provider.

## 2014-03-17 NOTE — ED Provider Notes (Signed)
Patient seen and evaluated. He also discussed with Roxy Horsemanobert Browning PA. Patient reports palpitations for several weeks. Described as "skipped beats and then some fast beats". No sign of atrial fibrillation A. Frequent PACs noted. Troponin EKG showed no acute abnormalities were morphology changes. He has been on digitoxin and quinidine for over 20 years. He has no murmurs. Most recent echocardiogram does not show valvular heart disease. I think is appropriate for outpatient treatment. TSH is pending. Caffeine. Daily dose Toprol. Count his pulse daily and Toprol and recheck of pulse falls below 60.  [name], MD 03/17/14 1258

## 2014-03-18 ENCOUNTER — Encounter: Payer: Self-pay | Admitting: Family Medicine

## 2014-03-18 ENCOUNTER — Ambulatory Visit (INDEPENDENT_AMBULATORY_CARE_PROVIDER_SITE_OTHER): Payer: 59 | Admitting: Family Medicine

## 2014-03-18 ENCOUNTER — Telehealth: Payer: Self-pay | Admitting: Family Medicine

## 2014-03-18 VITALS — BP 110/80 | Temp 98.5°F | Wt 199.0 lb

## 2014-03-18 DIAGNOSIS — I4949 Other premature depolarization: Secondary | ICD-10-CM

## 2014-03-18 DIAGNOSIS — I499 Cardiac arrhythmia, unspecified: Secondary | ICD-10-CM | POA: Insufficient documentation

## 2014-03-18 DIAGNOSIS — R42 Dizziness and giddiness: Secondary | ICD-10-CM

## 2014-03-18 MED ORDER — LORAZEPAM 0.5 MG PO TABS: 0.5000 mg | ORAL_TABLET | ORAL | Status: DC

## 2014-03-18 NOTE — Patient Instructions (Signed)
Hold the beta blocker and only take if you're having episodes of symptomatic PACs  I put in a cardiac consult request with Dr. Excell Seltzerooper

## 2014-03-18 NOTE — Telephone Encounter (Signed)
Patient Information:  Caller Name: [name]  Phone: [phone]  Patient: Jeff Gonzales, Jeff Gonzales  Gender: Male  DOB: 09/23/1960  Age: 53 Years  PCP: Kelle Dartingodd, Jeffrey [facility](Family Practice)  Office Follow Up:  Does the office need to follow up with this patient?: No  Instructions For The Office: N/A  RN Note:  Patient calling regarding recent visit to ED on 03/17/14.  Monitored for irregular heartbeat and sent home with Rx Beta Blocker.  Told to follow up with Cardiologist, and appointment is scheduled for 03/20/14.  Current HR is steady and regular 51 - 55 beats per minute.  Patient requesting to be seen by Dr. Tawanna Coolerodd today.  Symptoms  Reason For Call & Symptoms: follow up irregular HB.  Seen in ED on 03/17/14  Reviewed Health History In EMR: Yes  Reviewed Medications In EMR: Yes  Reviewed Allergies In EMR: Yes  Reviewed Surgeries / Procedures: Yes  Date of Onset of Symptoms: 03/17/2014  Guideline(s) Used:  No Protocol Available - Sick Adult  Disposition Per Guideline:   See Today in Office  Reason For Disposition Reached:   Patient wants to be seen  Advice Given:  Call Back If:  New symptoms develop  Patient Will Follow Care Advice:  YES  Appointment Scheduled:  03/18/2014 15:30:00 Appointment Scheduled Provider:  Kelle Dartingodd, Jeffrey [facility](Family Practice)

## 2014-03-18 NOTE — Progress Notes (Signed)
Pre visit review using our clinic review tool, if applicable. No additional management support is needed unless otherwise documented below in the visit note. 

## 2014-03-18 NOTE — Progress Notes (Signed)
   Subjective:    Patient ID: Jeff Gonzales, male    DOB: 06/24/1961, 53 y.o.   MRN: [id number]  HPI Jeff Gonzales is a 53 year old male nonsmoker who comes in today for evaluation of her cardiac arrhythmia  He has a long-standing history of atrial fibrillation which is well controlled with dig and quinidine. I discussed this with cardiology on numerous occasions and may have also had since he stable he wouldn't do anything. To take him off his medication would require hospitalization and monitoring.  He's had a couple episodes in the past couple months of skipping heartbeat. It lasts for a little while he feels panicky will take an Ativan and it goes away. Yesterday at 4 AM he went to the bathroom nothing unusual but then noticed a skipping. He took one Ativan it didn't help took a second dose it didn't help by 8 AM he was then a skip every third beat and went to the emergency room. In the emergency room his monitor initially was normal. He was then placed on a continuous monitor which showed PACs. Cardiac workup and labs were all normal. Dig level was normal quinidine level pending  He's here today for followup. He's concerned because these episodes are getting more frequent and more severe   Review of Systems Review of systems otherwise negative    Objective:   Physical Exam  Well-developed well-nourished in no acute distress vital signs stable he is afebrile pulse is 60 and regular he's had 2 doses of Toprol 25 mg since yesterday  Cardiac exam negative      Assessment & Plan:  History of chronic A. fib currently asymptomatic on quinidine and dig  New persistent worsening arrhythmia PACs bothering patient......... cardiac consult.

## 2014-03-18 NOTE — Telephone Encounter (Signed)
Noted  

## 2014-03-20 ENCOUNTER — Ambulatory Visit: Payer: 59 | Admitting: Internal Medicine

## 2014-03-21 ENCOUNTER — Telehealth: Payer: Self-pay | Admitting: Cardiovascular Disease

## 2014-03-21 NOTE — Telephone Encounter (Signed)
Dr Excell Seltzerooper is not taking new patients at this time.  The pt has previously seen Dr Ladona Ridgelaylor in 2011 for rhythm issues and he should re-establish with Dr Ladona Ridgelaylor for cardiac arrhythmia issues or see a new Cardiologist to re-establish. I will forward this message back to the scheduler to contact the patient and arrange office visit.

## 2014-03-21 NOTE — Telephone Encounter (Signed)
New message           Pt PCP wants pt to see Dr. Excell Seltzerooper as a new pt / Pt was set up with Dr Rennis GoldenHilty before but cancelled / Referral is in Epic

## 2014-03-22 ENCOUNTER — Ambulatory Visit: Payer: 59 | Admitting: Cardiovascular Disease

## 2014-03-22 ENCOUNTER — Ambulatory Visit (INDEPENDENT_AMBULATORY_CARE_PROVIDER_SITE_OTHER): Payer: 59 | Admitting: Cardiovascular Disease

## 2014-03-22 ENCOUNTER — Encounter: Payer: Self-pay | Admitting: Cardiovascular Disease

## 2014-03-22 VITALS — BP 132/92 | HR 80 | Ht 71.0 in | Wt 196.0 lb

## 2014-03-22 DIAGNOSIS — I491 Atrial premature depolarization: Secondary | ICD-10-CM

## 2014-03-22 DIAGNOSIS — I480 Paroxysmal atrial fibrillation: Secondary | ICD-10-CM

## 2014-03-22 DIAGNOSIS — R002 Palpitations: Secondary | ICD-10-CM

## 2014-03-22 DIAGNOSIS — I4891 Unspecified atrial fibrillation: Secondary | ICD-10-CM

## 2014-03-22 DIAGNOSIS — F419 Anxiety disorder, unspecified: Secondary | ICD-10-CM

## 2014-03-22 DIAGNOSIS — F411 Generalized anxiety disorder: Secondary | ICD-10-CM

## 2014-03-22 MED ORDER — METOPROLOL TARTRATE 25 MG PO TABS: 12.5000 mg | ORAL_TABLET | Freq: Two times a day (BID) | ORAL | Status: DC | PRN

## 2014-03-22 NOTE — Progress Notes (Signed)
Patient ID: JAMESMICHAEL SHADD, male   DOB: 1961-04-11, 53 y.o.   MRN: [id number]       CARDIOLOGY CONSULT NOTE  Patient ID: MOHANAD CARSTEN MRN: [id number] DOB/AGE: 1961/03/23 53 y.o.  Admit date: (Not on file) Primary Physician Evette Georges, MD  Reason for Consultation: atrial fibrillation, palpitations  HPI: The patient is a 53 year old male who I am evaluating for the first time. He reportedly has a history of paroxysmal atrial fibrillation for which she has been taking digoxin and quinidine for several years (since 1980's as per pt). He was recently evaluated in the emergency department for palpitations. CBC, basic metabolic panel, troponin, TSH, magnesium, and digoxin levels were all normal. I did not find a quinidine level. ECG demonstrated normal sinus rhythm with a nonspecific ST segment and T wave abnormality, with an RSR prime pattern. QRS duration was 98 ms. He reportedly has been experiencing palpitations for several weeks. According to notes in Epic, cardiac monitoring demonstrated PACs with no evidence of atrial fibrillation, and this may have been from the ED note which mentions frequent PAC's. Most recent echocardiogram I find his from 2007 which demonstrated normal left ventricular systolic function and no significant valvular pathology.  He is a very astute historian and provides several details dating back to the 33s. He said he has had paroxysmal atrial fibrillation since his early 67s. He also has a history of anxiety dating back to this time. One episode was provoked when he was underwater and had been holding his breath for a long period of time. Again, he has been on digoxin and quinidine for roughly 25-30 years. He had seldom been experiencing PACs but does say "I know it each and every time when I have one". In the past several weeks he has had frequent palpitations. This has caused him to be anxious. He sometimes takes Ativan for this and his symptoms are alleviated. When  he was evaluated in the ED, he was given Toprol-XL 25 mg daily. He took one tablet on the subsequent day and his heart rate was in the 50-56 beat per minute range. While he did not feel dizzy or lightheaded nor experienced chest pain or shortness of breath, he promptly discontinued it. He said he is a minimalist when it comes to medications and procedures. He is currently asymptomatic. He said that he knows exactly when he is in atrial fibrillation and says that he has not been in atrial fibrillation for several years.  He has since reduced his caffeine and alcohol consumption.  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 Originally from [place], [place] and then lived in [place]. Nonsmoker. Drinks 2 beers and one glass of wine daily. Drinks two small cups of coffee daily.    No Known Allergies  Current Outpatient Prescriptions  Medication Sig Dispense Refill  . aspirin EC 81 MG tablet Take 81 mg by mouth daily.      . digoxin (LANOXIN) 0.25 MG tablet Take 0.125-0.25 mg by mouth daily.      Marland Kitchen LORazepam (ATIVAN) 0.5 MG tablet Take 1 tablet (0.5 mg total) by mouth as directed. take one tablet by mouth two times a day  30 tablet  1  . Multiple Vitamin (MULTIVITAMIN WITH MINERALS) TABS tablet Take 1 tablet by mouth daily.      Marland Kitchen quiniDINE gluconate 324 MG CR tablet Take 1,620-1,782 mg by mouth daily.      . metoprolol succinate (TOPROL-XL) 25 MG 24 hr  tablet Take 1 tablet (25 mg total) by mouth daily.  30 tablet  0   No current facility-administered medications for this visit.    Past Medical History  Diagnosis Date  . AF (atrial fibrillation)   . HA (headache)   . Obstructive sleep apnea 2004    mild osa --AHI 7/hr.  . Vertigo   . Keratosis, actinic     head    Past Surgical History  Procedure Laterality Date  . Neck surgery    . Wisdom tooth extraction      History   Social History  . Marital Status: Legally Separated    Spouse Name: N/A     Number of Children: N/A  . Years of Education: N/A   Occupational History  . Not on file.   Social History Main Topics  . Smoking status: Former Smoker -- 1.50 packs/day    Quit date: 08/16/1983  . Smokeless tobacco: Not on file  . Alcohol Use: 1.2 oz/week    2 Cans of beer per week  . Drug Use: No  . Sexual Activity: Not on file   Other Topics Concern  . Not on file   Social History Narrative  . No narrative on file     No family history of premature CAD in 1st degree relatives.  Prior to Admission medications   Medication Sig Start Date End Date Taking? Authorizing Provider  aspirin EC 81 MG tablet Take 81 mg by mouth daily.   Yes Historical Provider, MD  digoxin (LANOXIN) 0.25 MG tablet Take 0.125-0.25 mg by mouth daily.   Yes Historical Provider, MD  LORazepam (ATIVAN) 0.5 MG tablet Take 1 tablet (0.5 mg total) by mouth as directed. take one tablet by mouth two times a day 03/18/14  Yes Roderick PeeJeffrey A Todd, MD  Multiple Vitamin (MULTIVITAMIN WITH MINERALS) TABS tablet Take 1 tablet by mouth daily.   Yes Historical Provider, MD  quiniDINE gluconate 324 MG CR tablet Take 1,620-1,782 mg by mouth daily.   Yes Historical Provider, MD  metoprolol succinate (TOPROL-XL) 25 MG 24 hr tablet Take 1 tablet (25 mg total) by mouth daily. 03/17/14   Rolland PorterMark James, MD     Review of systems complete and found to be negative unless listed above in HPI     Physical exam Blood pressure 132/92, pulse 80, height 5\' 11"  (1.803 m), weight 196 lb (88.905 kg), SpO2 98.00%. General: NAD Neck: No JVD, no thyromegaly or thyroid nodule.  Lungs: Clear to auscultation bilaterally with normal respiratory effort. CV: Nondisplaced PMI. Regular rate and rhythm, normal S1/S2, no S3/S4, no murmur.  No peripheral edema.  No carotid bruit.  Normal pedal pulses.  Abdomen: Soft, nontender, no hepatosplenomegaly, no distention.  Skin: Intact without lesions or rashes.  Neurologic: Alert and oriented x 3.  Psych:  Normal affect. Extremities: No clubbing or cyanosis.  HEENT: Normal.   Labs:   Lab Results  Component Value Date   WBC 4.8 03/17/2014   HGB 15.0 03/17/2014   HCT 44.5 03/17/2014   MCV 85.4 03/17/2014   PLT 154 03/17/2014    Recent Labs Lab 03/17/14 0932  NA 143  K 3.9  CL 101  CO2 27  BUN 19  CREATININE 1.23  CALCIUM 9.5  GLUCOSE 108*   No results found for this basename: CKTOTAL, CKMB, CKMBINDEX, TROPONINI    Lab Results  Component Value Date   CHOL 148 11/26/2013   CHOL 135 09/14/2012   CHOL 144 06/15/2011  Lab Results  Component Value Date   HDL 40.00 11/26/2013   HDL 16.10* 09/14/2012   HDL 96.04* 06/15/2011   Lab Results  Component Value Date   LDLCALC 90 11/26/2013   LDLCALC 67 09/14/2012   LDLCALC 77 06/15/2011   Lab Results  Component Value Date   TRIG 90.0 11/26/2013   TRIG 188.0* 09/14/2012   TRIG 158.0* 06/15/2011   Lab Results  Component Value Date   CHOLHDL 4 11/26/2013   CHOLHDL 4 09/14/2012   CHOLHDL 4 06/15/2011   No results found for this basename: LDLDIRECT         Studies: No results found.  ASSESSMENT AND PLAN:  1. Palpitations/paroxysmal atrial fibrillation/PAC's: He is clearly quite symptomatic. He would prefer not to be on regular scheduled medications in addition to his digoxin and quinidine. Given the relative bradycardia he experienced with long-acting metoprolol (albeit asymptomatic), I will prescribe metoprolol tartrate 12.5 mg to be used as needed for palpitations. He is very content with this approach. I will also obtain an echocardiogram to evaluate for any evidence of structural heart disease. I will obtain a two-week event monitor to evaluate for any tachyarrhythmia paroxysms such as atrial fibrillation or SVT which may have been precipitated by frequent premature atrial contractions. The short acting beta blocker to be used as needed will hopefully suppress atrial ectopy which can potentiate atrial fibrillation. I also educated him on the  use of vagal maneuvers to help suppress atrial ectopy.  Dispo: f/u 8 weeks.  Signed: Prentice Docker, M.D., F.A.C.C.  03/22/2014, 4:27 PM

## 2014-03-22 NOTE — Patient Instructions (Signed)
Your physician recommends that you schedule a follow-up appointment in: 8 weeks    Your physician has recommended you make the following change in your medication:     STOP Toprol XL  START Metoprolol tartrate 12.5 mg twice a day as needed for palpitations     Your physician has recommended that you wear an event monitor for 2 weeks. Event monitors are medical devices that record the heart's electrical activity. Doctors most often us these monitors to diagnose arrhythmias. Arrhythmias are problems with the speed or rhythm of the heartbeat. The monitor is a small, portable device. You can wear one while you do your normal daily activities. This is usually used to diagnose what is causing palpitations/syncope (passing out).      Thank you for choosing [facility] !

## 2014-03-25 ENCOUNTER — Telehealth: Payer: Self-pay | Admitting: Cardiovascular Disease

## 2014-03-25 LAB — QUINIDINE LEVEL

## 2014-03-25 NOTE — Telephone Encounter (Signed)
Pt worried echo was with contrast, explained that he was having having basic echo only

## 2014-03-25 NOTE — Telephone Encounter (Signed)
Patient would like to discuss echo to be done Wednesday / tgs

## 2014-03-25 NOTE — Telephone Encounter (Signed)
lmtcb

## 2014-03-25 NOTE — ED Provider Notes (Signed)
Medical screening examination/treatment/procedure(s) were conducted as a shared visit with non-physician practitioner(s) and myself.  I personally evaluated the patient during the encounter.   EKG Interpretation   Date/Time:  Sunday March 17 2014 09:11:46 EDT Ventricular Rate:  78 PR Interval:  196 QRS Duration: 98 QT Interval:  506 QTC Calculation: 576 R Axis:   -98 Text Interpretation:  Normal sinus rhythm Right superior axis deviation  Pulmonary disease pattern Incomplete right bundle branch block Right  ventricular hypertrophy with repolarization abnormality Cannot rule out  Inferior infarct , age undetermined Prolonged QT Abnormal ECG Confirmed by  Fayrene FearingJAMES  MD, Klani Caridi ([id number]) on 03/17/2014 9:34:39 AM      I saw and examined this patient and family. I reviewed his history. He is concerned that he is in A. fib. Not currently in atrial fibrillation. His monitor shows premature atrial, and occasional premature ventricular contractions. These exactly reproduce his symptoms. No signs of ischemia. No sign of sustained ectopy or tachycardia palpitations. He was given a dose of beta blocker here. I referred his cardiology. His primary care physician has been treating his ectopy with digitoxin and quinidine for years. Think they're better options available for him including a beta blocker. Given referral to cardiology for this. He is appropriate for outpatient treatment. No signs of failure on exam.  Rolland PorterMark Imajean Mcdermid, MD 03/25/14 1447

## 2014-03-27 ENCOUNTER — Ambulatory Visit (HOSPITAL_COMMUNITY)
Admission: RE | Admit: 2014-03-27 | Discharge: 2014-03-27 | Disposition: A | Discharge status: Home / Self Care | Payer: 59 | Source: Ambulatory Visit | Attending: Cardiovascular Disease | Admitting: Cardiovascular Disease

## 2014-03-27 DIAGNOSIS — R002 Palpitations: Secondary | ICD-10-CM | POA: Insufficient documentation

## 2014-03-27 DIAGNOSIS — I4891 Unspecified atrial fibrillation: Secondary | ICD-10-CM

## 2014-03-27 DIAGNOSIS — I491 Atrial premature depolarization: Secondary | ICD-10-CM

## 2014-03-27 NOTE — Progress Notes (Signed)
*  PRELIMINARY RESULTS* Echocardiogram 2D Echocardiogram has been performed.  Jeryl ColumbiaLLIOTT, Zoeie Ritter 03/27/2014, 4:22 PM

## 2014-04-01 ENCOUNTER — Telehealth: Payer: Self-pay | Admitting: *Deleted

## 2014-04-01 NOTE — Telephone Encounter (Signed)
Pt notified of results and understood

## 2014-04-01 NOTE — Telephone Encounter (Signed)
Message copied by Vernon PreyBARKER, STACI T on Mon Apr 01, 2014  3:13 PM ------      Message from: Prentice DockerKONESWARAN, SURESH A      Created: Mon Apr 01, 2014  9:43 AM       Normal pumping function. ------

## 2014-04-11 ENCOUNTER — Other Ambulatory Visit: Payer: Self-pay | Admitting: *Deleted

## 2014-04-11 DIAGNOSIS — R002 Palpitations: Secondary | ICD-10-CM

## 2014-05-23 ENCOUNTER — Ambulatory Visit: Payer: 59 | Admitting: Cardiovascular Disease

## 2015-01-07 ENCOUNTER — Other Ambulatory Visit: Payer: Self-pay | Admitting: Family Medicine

## 2015-01-07 ENCOUNTER — Telehealth: Payer: Self-pay | Admitting: Family Medicine

## 2015-01-07 DIAGNOSIS — I4891 Unspecified atrial fibrillation: Secondary | ICD-10-CM

## 2015-01-07 DIAGNOSIS — Z0000 Encounter for general adult medical examination without abnormal findings: Secondary | ICD-10-CM

## 2015-01-07 DIAGNOSIS — I4949 Other premature depolarization: Secondary | ICD-10-CM

## 2015-01-07 NOTE — Telephone Encounter (Signed)
Pt needs refill of digoxin (LANOXIN) 0.25 MG tablet quiniDINE gluconate 324 MG CR tablet  Pt needs these levels checked at [facility] on 05/08/15.  [place]/ [address]

## 2015-01-08 NOTE — Telephone Encounter (Signed)
Labs ordered and refills sent.

## 2015-01-21 ENCOUNTER — Telehealth: Payer: Self-pay | Admitting: Family Medicine

## 2015-01-21 NOTE — Telephone Encounter (Signed)
Per Dr Tawanna Coolerodd, patient should not take glucosamine chondroitin. Left detailed message on machine for patient.

## 2015-01-21 NOTE — Telephone Encounter (Signed)
Pt would like to know if he can  take  glucosamine chondroitin with his current meds.

## 2015-03-12 ENCOUNTER — Other Ambulatory Visit: Payer: Self-pay | Admitting: Family Medicine

## 2015-04-11 ENCOUNTER — Other Ambulatory Visit: Payer: Self-pay | Admitting: Family Medicine

## 2015-05-08 ENCOUNTER — Other Ambulatory Visit (INDEPENDENT_AMBULATORY_CARE_PROVIDER_SITE_OTHER): Payer: 59

## 2015-05-08 DIAGNOSIS — I4949 Other premature depolarization: Secondary | ICD-10-CM | POA: Diagnosis not present

## 2015-05-08 DIAGNOSIS — I4891 Unspecified atrial fibrillation: Secondary | ICD-10-CM

## 2015-05-08 DIAGNOSIS — Z0000 Encounter for general adult medical examination without abnormal findings: Secondary | ICD-10-CM | POA: Diagnosis not present

## 2015-05-08 LAB — POCT URINALYSIS DIPSTICK
BILIRUBIN UA: NEGATIVE
Blood, UA: NEGATIVE
Glucose, UA: NEGATIVE
Ketones, UA: NEGATIVE
LEUKOCYTES UA: NEGATIVE
Nitrite, UA: NEGATIVE
PH UA: 6
Protein, UA: NEGATIVE
Spec Grav, UA: >=1.03
Urobilinogen, UA: 0.2

## 2015-05-08 LAB — CBC WITH DIFFERENTIAL/PLATELET
BASOS ABS: 0 10*3/uL (ref 0.0–0.1)
BASOS PCT: 0.5 % (ref 0.0–3.0)
EOS ABS: 0.1 10*3/uL (ref 0.0–0.7)
Eosinophils Relative: 3 % (ref 0.0–5.0)
HEMATOCRIT: 44.4 % (ref 39.0–52.0)
Hemoglobin: 14.8 g/dL (ref 13.0–17.0)
LYMPHS ABS: 1.2 10*3/uL (ref 0.7–4.0)
LYMPHS PCT: 29.8 % (ref 12.0–46.0)
MCHC: 33.2 g/dL (ref 30.0–36.0)
MCV: 86.7 fl (ref 78.0–100.0)
Monocytes Absolute: 0.3 10*3/uL (ref 0.1–1.0)
Monocytes Relative: 8.3 % (ref 3.0–12.0)
NEUTROS ABS: 2.3 10*3/uL (ref 1.4–7.7)
Neutrophils Relative %: 58.4 % (ref 43.0–77.0)
PLATELETS: 166 10*3/uL (ref 150.0–400.0)
RBC: 5.12 Mil/uL (ref 4.22–5.81)
RDW: 13 % (ref 11.5–15.5)
WBC: 3.9 10*3/uL — ABNORMAL LOW (ref 4.0–10.5)

## 2015-05-08 LAB — TSH: TSH: 2.8 u[IU]/mL (ref 0.35–4.50)

## 2015-05-08 LAB — BASIC METABOLIC PANEL
BUN: 24 mg/dL — ABNORMAL HIGH (ref 6–23)
CO2: 29 mEq/L (ref 19–32)
Calcium: 9 mg/dL (ref 8.4–10.5)
Chloride: 104 mEq/L (ref 96–112)
Creatinine, Ser: 1.22 mg/dL (ref 0.40–1.50)
GFR: 65.27 mL/min (ref >60.00)
GLUCOSE: 86 mg/dL (ref 70–99)
POTASSIUM: 3.9 meq/L (ref 3.5–5.1)
SODIUM: 141 meq/L (ref 135–145)

## 2015-05-08 LAB — LIPID PANEL
Cholesterol: 125 mg/dL (ref 0–200)
HDL: 39.1 mg/dL (ref >39.00)
LDL Cholesterol: 69 mg/dL (ref 0–99)
NONHDL: 86.22
TRIGLYCERIDES: 87 mg/dL (ref 0.0–149.0)
Total CHOL/HDL Ratio: 3
VLDL: 17.4 mg/dL (ref 0.0–40.0)

## 2015-05-08 LAB — HEPATIC FUNCTION PANEL
ALBUMIN: 4.4 g/dL (ref 3.5–5.2)
ALK PHOS: 58 U/L (ref 39–117)
ALT: 17 U/L (ref 0–53)
AST: 16 U/L (ref 0–37)
BILIRUBIN DIRECT: 0.1 mg/dL (ref 0.0–0.3)
TOTAL PROTEIN: 6.4 g/dL (ref 6.0–8.3)
Total Bilirubin: 0.5 mg/dL (ref 0.2–1.2)

## 2015-05-08 LAB — PSA: PSA: 0.61 ng/mL (ref 0.10–4.00)

## 2015-05-09 LAB — DIGOXIN LEVEL: Digoxin Level: 0.9 ug/L (ref 0.8–2.0)

## 2015-05-10 LAB — QUINIDINE LEVEL: QUINIDINE: 1.5 mg/L — AB (ref 2.0–5.0)

## 2015-05-13 ENCOUNTER — Other Ambulatory Visit: Payer: Self-pay | Admitting: Family Medicine

## 2015-05-19 ENCOUNTER — Ambulatory Visit (INDEPENDENT_AMBULATORY_CARE_PROVIDER_SITE_OTHER): Payer: 59 | Admitting: Family Medicine

## 2015-05-19 ENCOUNTER — Encounter: Payer: Self-pay | Admitting: Family Medicine

## 2015-05-19 VITALS — BP 120/80 | Temp 98.3°F | Ht 70.5 in | Wt 193.0 lb

## 2015-05-19 DIAGNOSIS — Z23 Encounter for immunization: Secondary | ICD-10-CM

## 2015-05-19 DIAGNOSIS — R42 Dizziness and giddiness: Secondary | ICD-10-CM

## 2015-05-19 DIAGNOSIS — I4891 Unspecified atrial fibrillation: Secondary | ICD-10-CM

## 2015-05-19 MED ORDER — METOPROLOL TARTRATE 25 MG PO TABS: 12.5000 mg | ORAL_TABLET | Freq: Two times a day (BID) | ORAL | Status: DC | PRN

## 2015-05-19 MED ORDER — DIGOXIN 250 MCG PO TABS: ORAL_TABLET | ORAL | Status: DC

## 2015-05-19 MED ORDER — LORAZEPAM 0.5 MG PO TABS
0.5000 mg | ORAL_TABLET | ORAL | Status: DC
Start: 2015-05-19 — End: 2016-06-09

## 2015-05-19 MED ORDER — QUINIDINE GLUCONATE ER 324 MG PO TBCR: EXTENDED_RELEASE_TABLET | ORAL | Status: DC

## 2015-05-19 NOTE — Patient Instructions (Signed)
Continue current medications  Continue current exercise program  Follow-up in one year sooner if any problems  Engineering geologist......Marland Kitchen our new adult nurse practitioner from [facility]  I will put in a request for GI to call you about discussing screening colonoscopies. I would strongly recommend

## 2015-05-19 NOTE — Progress Notes (Signed)
   Subjective:    Patient ID: Jeff Gonzales, male    DOB: [birth year], 54 y.o.   MRN: [id number]  HPI Jeff Gonzales is a 54 year old divorce male nonsmoker who comes in today for general physical examination because of a history of chronic atrial fibrillation  He's been on a combination of Lanoxin and quinidine for 30+ years. He's seeing cardiology in the past and we discussed admitting him tapering off his medication and switching to other medications however he's done so well he is reluctant to do that. I agree with that at this juncture.  He saw cardiology about a year ago because of an episode of rapid heart rate. Bedtime is given a short acting beta blocker to take on a when necessary basis.  He's swimming a mile and a half a day and his cardiac symptoms are minimal and he really takes the beta blocker.  He gets routine eye care, dental care, still declines a colonoscopy. I've convinced him to go to GI to at least talk to them about screening colonoscopy.  Vaccinations updated by Fleet Contras   Review of Systems  Constitutional: Negative.   HENT: Negative.   Eyes: Negative.   Respiratory: Negative.   Cardiovascular: Negative.   Gastrointestinal: Negative.   Endocrine: Negative.   Genitourinary: Negative.   Musculoskeletal: Negative.   Skin: Negative.   Allergic/Immunologic: Negative.   Neurological: Negative.   Hematological: Negative.   Psychiatric/Behavioral: Negative.        Objective:   Physical Exam  Constitutional: He is oriented to person, place, and time. He appears well-developed and well-nourished.  HENT:  Head: Normocephalic and atraumatic.  Right Ear: External ear normal.  Left Ear: External ear normal.  Nose: Nose normal.  Mouth/Throat: Oropharynx is clear and moist.  Eyes: Conjunctivae and EOM are normal. Pupils are equal, round, and reactive to light.  Neck: Normal range of motion. Neck supple. No JVD present. No tracheal deviation present. No thyromegaly present.    Cardiovascular: Normal rate, regular rhythm, normal heart sounds and intact distal pulses.  Exam reveals no gallop and no friction rub.   No murmur heard. Pulmonary/Chest: Effort normal and breath sounds normal. No stridor. No respiratory distress. He has no wheezes. He has no rales. He exhibits no tenderness.  Abdominal: Soft. Bowel sounds are normal. He exhibits no distension and no mass. There is no tenderness. There is no rebound and no guarding.  Genitourinary: Rectum normal and penis normal. Guaiac negative stool. No penile tenderness.  1+ symmetrical nonnodular BPH  Musculoskeletal: Normal range of motion. He exhibits no edema or tenderness.  Lymphadenopathy:    He has no cervical adenopathy.  Neurological: He is alert and oriented to person, place, and time. He has normal reflexes. No cranial nerve deficit. He exhibits normal muscle tone.  Skin: Skin is warm and dry. No rash noted. No erythema. No pallor.  Psychiatric: He has a normal mood and affect. His behavior is normal. Judgment and thought content normal.  Nursing note and vitals reviewed.         Assessment & Plan:  Healthy male  History of atrial fib well controlled with quinidine and Lanoxin, continue current medications follow-up in one year  Mild BPH asymptomatic... No therapy

## 2015-05-19 NOTE — Progress Notes (Signed)
Pre visit review using our clinic review tool, if applicable. No additional management support is needed unless otherwise documented below in the visit note. 

## 2015-09-07 DIAGNOSIS — H539 Unspecified visual disturbance: Secondary | ICD-10-CM

## 2015-09-07 HISTORY — DX: Unspecified visual disturbance: H53.9

## 2015-09-07 HISTORY — PX: EYE SURGERY: SHX253

## 2016-05-07 ENCOUNTER — Telehealth: Payer: Self-pay | Admitting: Family Medicine

## 2016-05-07 DIAGNOSIS — I4891 Unspecified atrial fibrillation: Secondary | ICD-10-CM

## 2016-05-07 NOTE — Telephone Encounter (Signed)
° °  Pt said Dr Tawanna Coolerodd need to add a lab for the blood levels of the following meds that he takes to his lab appt that he has on 06/03/16    [facility]

## 2016-05-07 NOTE — Telephone Encounter (Signed)
I will follow up with Dr.Todd and make the proper adjustments upon his approval.

## 2016-05-11 NOTE — Telephone Encounter (Signed)
Per Dr. Tawanna Coolerodd labs have been added to pt's appointment.

## 2016-06-03 ENCOUNTER — Other Ambulatory Visit (INDEPENDENT_AMBULATORY_CARE_PROVIDER_SITE_OTHER): Payer: 59

## 2016-06-03 DIAGNOSIS — I4891 Unspecified atrial fibrillation: Secondary | ICD-10-CM

## 2016-06-03 DIAGNOSIS — Z0000 Encounter for general adult medical examination without abnormal findings: Secondary | ICD-10-CM

## 2016-06-03 LAB — CBC WITH DIFFERENTIAL/PLATELET
BASOS PCT: 0.5 % (ref 0.0–3.0)
Basophils Absolute: 0 10*3/uL (ref 0.0–0.1)
EOS ABS: 0.1 10*3/uL (ref 0.0–0.7)
EOS PCT: 3.1 % (ref 0.0–5.0)
HCT: 43.3 % (ref 39.0–52.0)
Hemoglobin: 14.6 g/dL (ref 13.0–17.0)
LYMPHS ABS: 1.3 10*3/uL (ref 0.7–4.0)
Lymphocytes Relative: 28.4 % (ref 12.0–46.0)
MCHC: 33.7 g/dL (ref 30.0–36.0)
MCV: 84.5 fl (ref 78.0–100.0)
MONO ABS: 0.4 10*3/uL (ref 0.1–1.0)
Monocytes Relative: 7.9 % (ref 3.0–12.0)
NEUTROS ABS: 2.7 10*3/uL (ref 1.4–7.7)
NEUTROS PCT: 60.1 % (ref 43.0–77.0)
PLATELETS: 183 10*3/uL (ref 150.0–400.0)
RBC: 5.12 Mil/uL (ref 4.22–5.81)
RDW: 13.4 % (ref 11.5–15.5)
WBC: 4.5 10*3/uL (ref 4.0–10.5)

## 2016-06-03 LAB — PSA: PSA: 0.78 ng/mL (ref 0.10–4.00)

## 2016-06-03 LAB — LIPID PANEL
CHOL/HDL RATIO: 3
CHOLESTEROL: 120 mg/dL (ref 0–200)
HDL: 39.1 mg/dL (ref >39.00)
LDL CALC: 63 mg/dL (ref 0–99)
NonHDL: 80.77
TRIGLYCERIDES: 88 mg/dL (ref 0.0–149.0)
VLDL: 17.6 mg/dL (ref 0.0–40.0)

## 2016-06-03 LAB — BASIC METABOLIC PANEL
BUN: 22 mg/dL (ref 6–23)
CHLORIDE: 105 meq/L (ref 96–112)
CO2: 30 mEq/L (ref 19–32)
CREATININE: 1.29 mg/dL (ref 0.40–1.50)
Calcium: 9 mg/dL (ref 8.4–10.5)
GFR: 60.97 mL/min (ref >60.00)
Glucose, Bld: 86 mg/dL (ref 70–99)
POTASSIUM: 4.3 meq/L (ref 3.5–5.1)
SODIUM: 141 meq/L (ref 135–145)

## 2016-06-03 LAB — HEPATIC FUNCTION PANEL
ALT: 15 U/L (ref 0–53)
AST: 16 U/L (ref 0–37)
Albumin: 4.2 g/dL (ref 3.5–5.2)
Alkaline Phosphatase: 63 U/L (ref 39–117)
BILIRUBIN DIRECT: 0.1 mg/dL (ref 0.0–0.3)
BILIRUBIN TOTAL: 0.6 mg/dL (ref 0.2–1.2)
Total Protein: 6.5 g/dL (ref 6.0–8.3)

## 2016-06-03 LAB — TSH: TSH: 4.4 u[IU]/mL (ref 0.35–4.50)

## 2016-06-03 LAB — POC URINALSYSI DIPSTICK (AUTOMATED)
Bilirubin, UA: NEGATIVE
Blood, UA: NEGATIVE
GLUCOSE UA: NEGATIVE
KETONES UA: NEGATIVE
Nitrite, UA: NEGATIVE
Protein, UA: NEGATIVE
SPEC GRAV UA: 1.025
Urobilinogen, UA: 0.2
pH, UA: 5

## 2016-06-04 LAB — DIGOXIN LEVEL: DIGOXIN LVL: 1 ug/L (ref 0.8–2.0)

## 2016-06-05 LAB — QUINIDINE LEVEL: QUINIDINE: 1.5 mg/L — AB (ref 2.0–5.0)

## 2016-06-09 ENCOUNTER — Encounter: Payer: Self-pay | Admitting: Family Medicine

## 2016-06-09 ENCOUNTER — Ambulatory Visit (INDEPENDENT_AMBULATORY_CARE_PROVIDER_SITE_OTHER): Payer: 59 | Admitting: Family Medicine

## 2016-06-09 VITALS — BP 136/80 | HR 61 | Temp 98.0°F | Wt 188.0 lb

## 2016-06-09 DIAGNOSIS — R42 Dizziness and giddiness: Secondary | ICD-10-CM

## 2016-06-09 DIAGNOSIS — Z0000 Encounter for general adult medical examination without abnormal findings: Secondary | ICD-10-CM

## 2016-06-09 DIAGNOSIS — Z23 Encounter for immunization: Secondary | ICD-10-CM

## 2016-06-09 DIAGNOSIS — I499 Cardiac arrhythmia, unspecified: Secondary | ICD-10-CM | POA: Diagnosis not present

## 2016-06-09 MED ORDER — METOPROLOL TARTRATE 25 MG PO TABS: 12.5000 mg | ORAL_TABLET | Freq: Two times a day (BID) | ORAL | 3 refills | Status: DC | PRN

## 2016-06-09 MED ORDER — QUINIDINE GLUCONATE ER 324 MG PO TBCR: EXTENDED_RELEASE_TABLET | ORAL | 11 refills | Status: DC

## 2016-06-09 MED ORDER — LORAZEPAM 0.5 MG PO TABS: ORAL_TABLET | ORAL | 3 refills | Status: DC

## 2016-06-09 MED ORDER — DIGOXIN 250 MCG PO TABS: ORAL_TABLET | ORAL | 3 refills | Status: DC

## 2016-06-09 NOTE — Progress Notes (Signed)
Jeff Gonzales is a 55 year old single male nonsmoker who comes in today for general physical examination because of a history of cardiac arrhythmia  He had a atrial arrhythmia atrial fibrillation many years ago. He was placed on a combination of Lanoxin and quinidine. He's done well all these years and has no recurrence of his atrial fibrillation. We have consulted with cardiology about transferring his medication however this recur hospitalization monitoring and the patient is content to take his medicine since he's done well and has had no side effects.  He also takes Ativan 0.5 daily when necessary for anxiety and Lopressor 25 mg dose one half tab when necessary for palpitations  He gets routine eye care, dental care, has always refused a colonoscopy. This year he is willing to talk to him about some of the newer noninvasive screening test for colon cancer.  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 He swims 2-34 miles a week. He is a Counselling psychologistswimmer in the senior games.  Family history unchanged  He is systems negative except for occasional pain in his left knee. No history of ligament or cartilage problems. He points the patellar tendon as a source of his discomfort.  Physical examination vital signs stable he is afebrile HEENT were negative neck was supple no adenopathy thyroid normal cardiopulmonary exam normal abdominal exam normal genitalia normal circumcised male rectum normal stool guaiac-negative prostate normal extremity normal skin normal peripheral pulses normal  Left knee shows no evidence of any cartilage or ligament damage. He has some tenderness at the patellar tendon  Impression  #1 history of atrial arrhythmia atrial fib in sinus rhythm asymptomatic............. continue current medications  #2 palpitations........Marland Kitchen. Lopressor 12.5 mg twice a day when necessary  #3 history of mild anxiety......Marland Kitchen. Ativan 0.5 when  necessary  #4 patellar tendinitis left knee...Marland Kitchen.Marland Kitchen.Marland Kitchen. elevation and ice and Motrin when necessary

## 2016-06-09 NOTE — Patient Instructions (Signed)
Continue current medications  Follow-up in one year sooner if any problems 

## 2016-06-09 NOTE — Progress Notes (Signed)
Pre visit review using our clinic review tool, if applicable. No additional management support is needed unless otherwise documented below in the visit note. 

## 2016-07-12 ENCOUNTER — Encounter: Payer: Self-pay | Admitting: Family Medicine

## 2016-10-01 ENCOUNTER — Other Ambulatory Visit: Payer: Self-pay | Admitting: Family Medicine

## 2016-10-01 ENCOUNTER — Ambulatory Visit
Admission: RE | Admit: 2016-10-01 | Discharge: 2016-10-01 | Disposition: A | Discharge status: Home / Self Care | Payer: BC Managed Care – PPO | Source: Ambulatory Visit | Attending: Family Medicine | Admitting: Family Medicine

## 2016-10-01 DIAGNOSIS — M542 Cervicalgia: Secondary | ICD-10-CM

## 2017-02-09 ENCOUNTER — Other Ambulatory Visit
Admission: RE | Admit: 2017-02-09 | Discharge: 2017-02-09 | Disposition: A | Discharge status: Home / Self Care | Payer: BC Managed Care – PPO | Source: Ambulatory Visit | Attending: Gastroenterology | Admitting: Gastroenterology

## 2017-02-09 ENCOUNTER — Ambulatory Visit: Payer: Self-pay

## 2017-02-09 DIAGNOSIS — K635 Polyp of colon: Secondary | ICD-10-CM | POA: Insufficient documentation

## 2017-02-09 DIAGNOSIS — Z86010 Personal history of colonic polyps: Secondary | ICD-10-CM | POA: Insufficient documentation

## 2017-02-09 DIAGNOSIS — K5730 Diverticulosis of large intestine without perforation or abscess without bleeding: Secondary | ICD-10-CM | POA: Insufficient documentation

## 2017-02-11 LAB — LAB USE ONLY - HISTORICAL SURGICAL PATHOLOGY

## 2017-05-27 ENCOUNTER — Encounter: Payer: Self-pay | Admitting: Family Medicine

## 2017-06-14 ENCOUNTER — Telehealth: Payer: Self-pay | Admitting: Family Medicine

## 2017-06-14 DIAGNOSIS — I4891 Unspecified atrial fibrillation: Secondary | ICD-10-CM

## 2017-06-14 NOTE — Telephone Encounter (Signed)
Pt would like to know if you will allow him to do cpx labs prior to his appt this year. Pt states you know what he needs.

## 2017-06-14 NOTE — Telephone Encounter (Signed)
Per Dr. Tawanna Cooler he is okay with pt having lab appt before his CPE, please schedule both, I have put lab orders in

## 2017-06-15 NOTE — Telephone Encounter (Signed)
Left message on voicemail to call back and schedule

## 2017-06-19 ENCOUNTER — Other Ambulatory Visit: Payer: Self-pay | Admitting: Family Medicine

## 2017-06-22 NOTE — Telephone Encounter (Signed)
Pt has been scheduled.  °

## 2017-06-28 ENCOUNTER — Other Ambulatory Visit (INDEPENDENT_AMBULATORY_CARE_PROVIDER_SITE_OTHER): Payer: 59

## 2017-06-28 DIAGNOSIS — I4891 Unspecified atrial fibrillation: Secondary | ICD-10-CM

## 2017-06-28 LAB — CBC WITH DIFFERENTIAL/PLATELET
Basophils Absolute: 0 10*3/uL (ref 0.0–0.1)
Basophils Relative: 0.5 % (ref 0.0–3.0)
EOS PCT: 2.8 % (ref 0.0–5.0)
Eosinophils Absolute: 0.1 10*3/uL (ref 0.0–0.7)
HEMATOCRIT: 44.6 % (ref 39.0–52.0)
HEMOGLOBIN: 14.8 g/dL (ref 13.0–17.0)
Lymphocytes Relative: 26.2 % (ref 12.0–46.0)
Lymphs Abs: 1.1 10*3/uL (ref 0.7–4.0)
MCHC: 33.1 g/dL (ref 30.0–36.0)
MCV: 87.9 fl (ref 78.0–100.0)
MONO ABS: 0.4 10*3/uL (ref 0.1–1.0)
Monocytes Relative: 8.6 % (ref 3.0–12.0)
Neutro Abs: 2.5 10*3/uL (ref 1.4–7.7)
Neutrophils Relative %: 61.9 % (ref 43.0–77.0)
Platelets: 188 10*3/uL (ref 150.0–400.0)
RBC: 5.07 Mil/uL (ref 4.22–5.81)
RDW: 13.6 % (ref 11.5–15.5)
WBC: 4.1 10*3/uL (ref 4.0–10.5)

## 2017-06-28 LAB — HEPATIC FUNCTION PANEL
ALT: 19 U/L (ref 0–53)
AST: 19 U/L (ref 0–37)
Albumin: 4.4 g/dL (ref 3.5–5.2)
Alkaline Phosphatase: 56 U/L (ref 39–117)
BILIRUBIN TOTAL: 0.7 mg/dL (ref 0.2–1.2)
Bilirubin, Direct: 0.1 mg/dL (ref 0.0–0.3)
TOTAL PROTEIN: 6.5 g/dL (ref 6.0–8.3)

## 2017-06-28 LAB — LIPID PANEL
CHOLESTEROL: 140 mg/dL (ref 0–200)
HDL: 44.9 mg/dL (ref >39.00)
LDL CALC: 79 mg/dL (ref 0–99)
NonHDL: 95.36
Total CHOL/HDL Ratio: 3
Triglycerides: 80 mg/dL (ref 0.0–149.0)
VLDL: 16 mg/dL (ref 0.0–40.0)

## 2017-06-28 LAB — PSA: PSA: 1.71 ng/mL (ref 0.10–4.00)

## 2017-06-28 LAB — BASIC METABOLIC PANEL
BUN: 21 mg/dL (ref 6–23)
CHLORIDE: 102 meq/L (ref 96–112)
CO2: 32 mEq/L (ref 19–32)
Calcium: 9.4 mg/dL (ref 8.4–10.5)
Creatinine, Ser: 1.32 mg/dL (ref 0.40–1.50)
GFR: 59.15 mL/min — AB (ref >60.00)
Glucose, Bld: 95 mg/dL (ref 70–99)
POTASSIUM: 4.3 meq/L (ref 3.5–5.1)
SODIUM: 140 meq/L (ref 135–145)

## 2017-06-28 LAB — TSH: TSH: 2.7 u[IU]/mL (ref 0.35–4.50)

## 2017-07-01 LAB — QUINIDINE LEVEL: QUINIDINE: 1.6 mg/L — AB (ref 2.0–5.0)

## 2017-07-01 LAB — DIGOXIN LEVEL: Digoxin Level: 1.2 mcg/L (ref 0.8–2.0)

## 2017-07-05 ENCOUNTER — Ambulatory Visit (INDEPENDENT_AMBULATORY_CARE_PROVIDER_SITE_OTHER): Payer: 59 | Admitting: Family Medicine

## 2017-07-05 ENCOUNTER — Encounter: Payer: Self-pay | Admitting: Family Medicine

## 2017-07-05 VITALS — BP 136/78 | HR 72 | Temp 98.0°F | Ht 70.0 in | Wt 194.0 lb

## 2017-07-05 DIAGNOSIS — I4891 Unspecified atrial fibrillation: Secondary | ICD-10-CM | POA: Diagnosis not present

## 2017-07-05 DIAGNOSIS — R42 Dizziness and giddiness: Secondary | ICD-10-CM

## 2017-07-05 DIAGNOSIS — Z0000 Encounter for general adult medical examination without abnormal findings: Secondary | ICD-10-CM

## 2017-07-05 MED ORDER — HYDROCORTISONE ACE-PRAMOXINE 1-1 % RE FOAM: 1.0000 | Freq: Two times a day (BID) | RECTAL | 0 refills | Status: DC

## 2017-07-05 MED ORDER — LORAZEPAM 0.5 MG PO TABS: ORAL_TABLET | ORAL | 3 refills | Status: DC

## 2017-07-05 MED ORDER — METOPROLOL TARTRATE 25 MG PO TABS: 12.5000 mg | ORAL_TABLET | Freq: Two times a day (BID) | ORAL | 3 refills | Status: DC | PRN

## 2017-07-05 NOTE — Progress Notes (Signed)
Jeff Gonzales is a 56 year old single male nonsmoker who comes in today for general physical exam because of a history of A. fib  We diagnosed of having atrial fibrillation about 30 years ago. He's been on digoxin 250 g daily along with quinidine 324 mg....... 2 tabs in the morning, 2 tabs in the afternoon, 2 tabs at bedtime. We've talked with cardiology on numerous occasions about transitioning him off these old-time he medications however to do that he would not be hospitalized and put on a monitor which she declines at this juncture. He says he feels well and has no complaints  He gets routine eye care, dental care, has never had a colonoscopy because he doesn't want to do the prep. He is willing to talk to them about the new noninvasive: Guard screening.  Vaccinations tetanus booster 2014 declines a seasonal flu shot today he can disease in a competitive swimming event this weekend and does not want to have the flu shot.  14 point review of systems reviewed and otherwise negative  EKG was done because a history of A. fib EKG was normal and unchanged  Social history......Marland Kitchen. 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 Examination HEENT were negative neck was supple thyroid not enlarged no carotid bruits cardiopulmonary exam normal abdominal exam normal genitalia normal circumcised male rectal normal stool guaiac-negative prostate normal extremities normal skin normal peripheral pulses normal  #1 healthy male  #2 history of A. Fib,,,,,,,,, in sinus rhythm on above medications. In order to transition off he would have to be hospitalized and put on a monitor which she declines this juncture. Therefore will continue current  medications

## 2017-07-05 NOTE — Patient Instructions (Signed)
Continue current medications  Follow-up in one year sooner if any problem 

## 2017-09-21 ENCOUNTER — Ambulatory Visit
Admission: AD | Admit: 2017-09-21 | Discharge: 2017-09-21 | Disposition: A | Discharge status: Home / Self Care | Payer: BC Managed Care – PPO | Source: Ambulatory Visit | Attending: Ophthalmology | Admitting: Ophthalmology

## 2017-09-21 ENCOUNTER — Ambulatory Visit: Payer: BC Managed Care – PPO | Admitting: Certified Registered"

## 2017-09-21 ENCOUNTER — Encounter: Payer: Self-pay | Admitting: Pain Medicine

## 2017-09-21 ENCOUNTER — Encounter
Admission: AD | Disposition: A | Discharge status: Home / Self Care | Payer: Self-pay | Source: Ambulatory Visit | Attending: Ophthalmology

## 2017-09-21 DIAGNOSIS — I10 Essential (primary) hypertension: Secondary | ICD-10-CM | POA: Insufficient documentation

## 2017-09-21 DIAGNOSIS — H33011 Retinal detachment with single break, right eye: Secondary | ICD-10-CM | POA: Insufficient documentation

## 2017-09-21 DIAGNOSIS — H269 Unspecified cataract: Secondary | ICD-10-CM | POA: Insufficient documentation

## 2017-09-21 HISTORY — DX: Essential (primary) hypertension: I10

## 2017-09-21 HISTORY — DX: Unspecified cataract: H26.9

## 2017-09-21 HISTORY — DX: Unspecified disorder of ear, unspecified ear: H93.90

## 2017-09-21 HISTORY — DX: Soft tissue disorder, unspecified: M79.9

## 2017-09-21 HISTORY — DX: Other diseases of pharynx: J39.2

## 2017-09-21 HISTORY — PX: VITRECTOMY, 25 GAUGE, MECHANICAL: SHX5707

## 2017-09-21 HISTORY — PX: VITRECTOMY, ENDO LASER/GAS-FLUID EXCH: SHX5711

## 2017-09-21 SURGERY — VITRECTOMY 25 GAUGE, MECHANICAL, PARS PLANA
Anesthesia: Anesthesia General | Site: Eye | Laterality: Right | Wound class: Clean

## 2017-09-21 MED ORDER — BUPIVACAINE HCL (PF) 0.75 % IJ SOLN
INTRAMUSCULAR | Status: DC | PRN
Start: 2017-09-21 — End: 2017-09-21
  Administered 2017-09-21: 1.75 mL

## 2017-09-21 MED ORDER — NEOMYCIN-POLYMYXIN-DEXAMETH 3.5-10000-0.1 OP OINT
TOPICAL_OINTMENT | OPHTHALMIC | Status: DC | PRN
Start: 2017-09-21 — End: 2017-09-21
  Administered 2017-09-21: 1 g via OPHTHALMIC

## 2017-09-21 MED ORDER — PREDNISOLONE ACETATE 1 % OP SUSP
1.00 [drp] | Freq: Four times a day (QID) | OPHTHALMIC | 1 refills | Status: DC
Start: 2017-09-21 — End: 2021-12-22

## 2017-09-21 MED ORDER — TETRACAINE HCL 0.5 % OP SOLN
OPHTHALMIC | Status: AC
Start: 2017-09-21 — End: ?
  Filled 2017-09-21: qty 4

## 2017-09-21 MED ORDER — TROPICAMIDE 1 % OP SOLN
1.0000 [drp] | OPHTHALMIC | Status: AC
Start: 2017-09-21 — End: 2017-09-21
  Administered 2017-09-21 (×3): 1 [drp] via OPHTHALMIC

## 2017-09-21 MED ORDER — DEXAMETHASONE SOD PHOSPHATE PF 10 MG/ML IJ SOLN
INTRAMUSCULAR | Status: DC | PRN
Start: 2017-09-21 — End: 2017-09-21
  Administered 2017-09-21: 5 mg

## 2017-09-21 MED ORDER — PERFLUOROPROPANE INJECTION
Status: DC | PRN
Start: 2017-09-21 — End: 2017-09-21
  Administered 2017-09-21: 1 via INTRAVITREAL

## 2017-09-21 MED ORDER — LIDOCAINE HCL 2 % IJ SOLN
INTRAMUSCULAR | Status: DC | PRN
Start: 2017-09-21 — End: 2017-09-21
  Administered 2017-09-21: 1.75 mL

## 2017-09-21 MED ORDER — OFLOXACIN 0.3 % OP SOLN
1.00 [drp] | Freq: Four times a day (QID) | OPHTHALMIC | 1 refills | Status: DC
Start: 2017-09-21 — End: 2021-12-22

## 2017-09-21 MED ORDER — MIDAZOLAM HCL 2 MG/2ML IJ SOLN
INTRAMUSCULAR | Status: AC
Start: 2017-09-21 — End: ?
  Filled 2017-09-21: qty 2

## 2017-09-21 MED ORDER — BSS IO SOLN
INTRAOCULAR | Status: DC | PRN
Start: 2017-09-21 — End: 2017-09-21
  Administered 2017-09-21: 20 mL

## 2017-09-21 MED ORDER — FENTANYL CITRATE (PF) 50 MCG/ML IJ SOLN (WRAP)
INTRAMUSCULAR | Status: DC | PRN
Start: 2017-09-21 — End: 2017-09-21
  Administered 2017-09-21: 50 ug via INTRAVENOUS
  Administered 2017-09-21 (×2): 25 ug via INTRAVENOUS

## 2017-09-21 MED ORDER — TETRACAINE HCL 0.5 % OP SOLN
OPHTHALMIC | Status: DC | PRN
Start: 2017-09-21 — End: 2017-09-21
  Administered 2017-09-21: 2 [drp] via OPHTHALMIC

## 2017-09-21 MED ORDER — TETRACAINE HCL 0.5 % OP SOLN
1.0000 [drp] | Freq: Once | OPHTHALMIC | Status: AC
Start: 2017-09-21 — End: 2017-09-21
  Administered 2017-09-21: 1 [drp] via OPHTHALMIC

## 2017-09-21 MED ORDER — PHENYLEPHRINE HCL 2.5 % OP SOLN
1.0000 [drp] | OPHTHALMIC | Status: AC
Start: 2017-09-21 — End: 2017-09-21
  Administered 2017-09-21 (×3): 1 [drp] via OPHTHALMIC

## 2017-09-21 MED ORDER — PHENYLEPHRINE HCL 2.5 % OP SOLN
OPHTHALMIC | Status: AC
Start: 2017-09-21 — End: ?
  Filled 2017-09-21: qty 1

## 2017-09-21 MED ORDER — CYCLOPENTOLATE HCL 1 % OP SOLN
OPHTHALMIC | Status: DC | PRN
Start: 2017-09-21 — End: 2017-09-21
  Administered 2017-09-21: 2 [drp] via OPHTHALMIC

## 2017-09-21 MED ORDER — MIDAZOLAM HCL 2 MG/2ML IJ SOLN
INTRAMUSCULAR | Status: DC | PRN
Start: 2017-09-21 — End: 2017-09-21
  Administered 2017-09-21 (×2): 1 mg via INTRAVENOUS

## 2017-09-21 MED ORDER — FENTANYL CITRATE (PF) 50 MCG/ML IJ SOLN (WRAP)
INTRAMUSCULAR | Status: AC
Start: 2017-09-21 — End: ?
  Filled 2017-09-21: qty 2

## 2017-09-21 MED ORDER — STERILE WATER FOR IRRIGATION IR SOLN
Status: DC | PRN
Start: 2017-09-21 — End: 2017-09-21
  Administered 2017-09-21: 250 mL

## 2017-09-21 MED ORDER — TROPICAMIDE 1 % OP SOLN
OPHTHALMIC | Status: AC
Start: 2017-09-21 — End: ?
  Filled 2017-09-21: qty 15

## 2017-09-21 MED ORDER — BALANCED SALT IO SOLN
INTRAOCULAR | Status: DC | PRN
Start: 2017-09-21 — End: 2017-09-21
  Administered 2017-09-21: 500 mL via OPHTHALMIC

## 2017-09-21 MED ORDER — LACTATED RINGERS IV SOLN
INTRAVENOUS | Status: DC
Start: 2017-09-21 — End: 2017-09-21

## 2017-09-21 MED ORDER — HYPROMELLOSE (GONIOSCOPIC) 2.5 % OP SOLN
OPHTHALMIC | Status: DC | PRN
Start: 2017-09-21 — End: 2017-09-21
  Administered 2017-09-21: 2 mL via TOPICAL

## 2017-09-21 SURGICAL SUPPLY — 30 items
CANNULA OPHTHALMIC SHORT SOFT TIP OD25 (Cannula) ×1
CANNULA OPHTHALMIC SHORT SOFT TIP OD25 GA ODSEC.8 MM GRIESHABER (Cannula) IMPLANT
CANNULA SOFT TIP 25G .8MM (Cannula) ×1
CAUTERY 25GA (Cautery) ×1 IMPLANT
CONSTELLATION AUTO GAS FILL PK (Opthamology Supply) ×1
EYE SHIELD SOFT BLUE RUBBER (Personal Protection) ×1
GOWN OPTIMA STRL BACK OR (Gown) ×2 IMPLANT
HANDLE BACKFLUSH DISP 23G (Opthamology Supply) ×1 IMPLANT
LABEL MEDICAL PACK CUSTOM RETINA (Other) ×1
LABEL MEDICAL PACK CUSTOM RETINA CL22746 CUSTOM MED LABEL PACK (Other) ×1 IMPLANT
LABEL SHEET CUSTOM RETINA (Other) ×1
NEEDLE ANES RETROBL 1.5X23G (Needles) ×1
NEEDLE OPHTHALMIC BD VISITEC ATKINSON (Needles) ×1
NEEDLE OPHTHALMIC BD VISITEC ATKINSON OD.6 MM L38 MM 22 D ANGLE POINT (Needles) ×1 IMPLANT
PACK VITRECTOMY 25G (Opthamology Supply) ×2 IMPLANT
PACK VITRECTOMY GAS FILL SYRINGE (Opthamology Supply) ×1
PACK VITRECTOMY GAS FILL SYRINGE CONSTELLATION ENGAUGE V-LOCITY (Opthamology Supply) IMPLANT
PROBE LASER ILLUM FLX CRVD 25G (Laser Supplies) ×1
PROBE LASER OD25 GA L2 IN ILLUMINATE (Laser Supplies) ×1
PROBE LASER OD25 GA L2 IN ILLUMINATE CURVE TIP LARGE TACTILE INDICATOR (Laser Supplies) IMPLANT
RESTRAINT EXTREMITY L36 IN REGULAR SOFT (Patient Supply) ×1
RESTRAINT EXTREMITY L36 IN REGULAR SOFT QUILT CUFF QUICK RELEASE (Patient Supply) ×1 IMPLANT
RESTRAINT LIMB HOLDER (Patient Supply) ×1
SHIELD OPHTHALMIC UNIVERSAL FLEXIBLE (Personal Protection) ×1
SHIELD OPHTHALMIC UNIVERSAL FLEXIBLE EDGE SOFGUARD THERMOPLASTIC (Personal Protection) ×1 IMPLANT
TAPE MICROPRE NLTX 1INX1.5YD (Tape) ×1
TAPE SURGICAL L1 1/2 YD X W1 IN (Tape) ×1
TAPE SURGICAL L1 1/2 YD X W1 IN HYPOALLERGENIC BREATHABLE MICROPORE (Tape) ×1 IMPLANT
WATER STERILE PLASTIC POUR BOTTLE 250 ML (Irrigation Solutions) ×1 IMPLANT
WATER STRL IRRIG 250ML BTL (Irrigation Solutions) ×1

## 2017-09-21 NOTE — Discharge Instr - AVS First Page (Addendum)
Reason for your Hospital Admission:  Repair of retinal detachment right eye      Instructions for after your discharge:  Post Operative Instructions:    Please bring this instruction sheet, the eyedrops and the eye kit with you to the office.  Keep the patch overtop of the eye until you are examined tomorrow. Continue all other medications unless told otherwise.  Call the office if you have any questions or problems with the eye.    1. Follow up: You will see Dr. Pauline Aus or their colleague tomorrow in the office as previously arranged.    2. Symptoms of Concern:  If the following symptoms develop, please contact the office immediately: worsening of eye pain, severe headache, vomiting, or decreasing vision.    3. Medications: BEGIN AFTER FIRST DRESSING CHANGE TOMORROW; WASH HANDS BEFORE INSTILLING DROPS        [x]  Ofloxacin or PolyTrim 4 x per day         [x]  Pred Forte 4 x per day     4. Pain Medications: Please take Tylenol 500 mg 2 tablets; or Ibuprofen 200 mg 2 tablets every 4-6 hours as needed for pain.      5. Positioning:         [x]  Face down 5 as much as possible during waking hrs.        [x]  Sleep on either side        [x]  Avoid airplane travel for 30 days         [x]  Avoid face up postioning for 30 days    6. Arm Band: If you received a green arm band at the time of surgery,           please do not remove it until instructed by your physician.     7. Activities:  Avoid strenuous activities: heavy lifting, straining, sudden movement. You may watch TV or read. You may shower or shampoo beginning in one day after surgery. Avoid getting water directly in the eye. [] [facility]  [address]  [place], [place] [address]  [phone]    [] [facility]  [address]  [place], [place] [address]  [phone]  Fax: [phone]    []  [place]  [address]  [facility] [address]  [place], [place] [address]    [] [address]  [address]  [place], [place] [address]  [phone]  Fax:  [phone]    [] [facility]  [address]  [place], [place] [address]  [phone]      [] [place]  [address]  [place] [address]  [phone]      [] [place]  [address]  [place], [place] [address]  [phone]    [] [facility]  [address] [place] [address]  [phone]    []   [address]  [place], [place] [address]  [phone]    []  [place]  [address] [place], [place] [address]  [phone]    [] [place]  [address]  [place], [place] [address]  [phone]    [] [facility]  [address]  [place], [place] [address]  [phone]    [] [facility]  [address]  [place], [place] [address]  [phone]                  Post Anesthesia Discharge Instructions  Although you may be awake and alert in the recovery room, small amounts of anesthetic remain in your system for about 24 hours.  You may feel tired and sleepy during this time.      You are advised to go directly home from the hospital.    Plan to stay at home and rest for the remainder of the day.    It is advisable to have someone with you at home for 24 hours after surgery.    Do not operate a motor vehicle, or any mechanical or electrical equipment for the next 24 hours.      Be careful when you are walking around, you may become dizzy.  The effects of anesthesia and/or medications are still present and drowsiness may occur    Do not consume alcohol, tranquilizers, sleeping medications, or any other non prescribed medication for the remainder of the day.    Diet:  begin with liquids, progress your diet as tolerated or as directed by your surgeon.  Nausea and vomiting may occur in the next 24 hours.
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
## 2017-09-21 NOTE — Anesthesia Preprocedure Evaluation (Signed)
Anesthesia Evaluation    AIRWAY    Mallampati: II    TM distance: >3 FB  Neck ROM: full  Mouth Opening:full  Planned to use difficult airway equipment: No CARDIOVASCULAR    cardiovascular exam normal       DENTAL    no notable dental hx     PULMONARY    pulmonary exam normal     OTHER FINDINGS                  Relevant Problems   No relevant active problems               Anesthesia Plan    ASA 2     general                     intravenous induction   Detailed anesthesia plan: general IV        Post op pain management: per surgeon    informed consent obtained    Plan discussed with CRNA.    ECG reviewed               Signed by: Loistine Chance 09/21/17 2:16 PM

## 2017-09-21 NOTE — Progress Notes (Signed)
Discharge instructions reviewed with patient and responsible adult at this time.  All questions answered at this time.  A signed copy of discharge instructions by responsible adult placed in chart.  A copy of discharge instructions along with prescription given to patient and responsible adult. Patient ambulated well. Tolerated liquid. No pain and nausea noted. Instructed patient and wife to follow MD's discharge instructions and stated understanding. VSS and recorded. Patient stated he is ready to go home. . Plan to d/c home.

## 2017-09-21 NOTE — Op Note (Signed)
FULL OPERATIVE NOTE    Date Time: 09/21/17 3:07 PM  Patient Name: Thomas Hawkins  Attending Physician: Theda Belfast, MD      Date of Operation:   09/21/2017    Providers Performing:   Surgeon: Theda Belfast, MD  Assistant: Maud Deed    Operative Procedure:   Procedure(s):  VITRECTOMY, 25 GAUGE, MECHANICAL    Preoperative Diagnosis:   Rhegmatogenous retinal detachment with single break, right eye    Postoperative Diagnosis:   Rhegmatogenous retinal detachment with single break, right eye    Indications:   Loss of vision    Operative Notes:   Informed consent was obtained prior the procedure. The procedure, risks, benefits, and  alternatives were discussed with the patient. In the preoperative holding area the operative eye was marked. The patient was then brought back to the operating room and placed under monitored anesthesia care.     The microscope was brought into position. The patient  received a Sub-Tenon's block consisting of .75% Marcaine and 2% xylocaine through an inferonasal cutdown incision.  Standard 25 gauge instrumentation was used to create sclerotomies 3.5 mm posterior to the limbus. The infusion cannula was verified prior to turning on the infusion.  Upon visualization with the light pipe, the retinal detachment extended from 3:00 to 8:00 with break at orra at 3:00.     A core vitrectomy was performed.  A posterior vitreous detachment was already present. Vitrectomy was carried out 360 degrees. The retina was inspected 360-degrees and no additional retinal tears or breaks were noted. Diathermy was applied to all breaks and used create a retinotomy inferonasally. An air fluid exchange was then performed draining fluid through retinotomy.  Endolaser was applied to the breaks after fluid air exchange and laser was applied to orra along the detached area. The eye was then flushed with 40 cc of 14% C3F8 gas to a moderate tactile tension.     The cannulas were pulled and the wounds  were airtight, and pressure was moderate to tactile tension. A subconjunctival injection of ancef and dexamethasone was given. At the end of case the IOP < 15 mmHg, the nerve was perfused, and the retina was attached 360. The eye was dressed with Neomycin Polymyxin Dexamethasone ointment. A patch was placed over the eye. The patient tolerated the procedure well. There were no complications.  The patient received post operative instructions including follow up with [facility].          Estimated Blood Loss:   Less than 1 cc    Specimens:   None    Complications:   None      Signed by: Maud Deed  09/21/17 3:07 PM

## 2017-09-21 NOTE — Transfer of Care (Signed)
Anesthesia Transfer of Care Note    Patient: Thomas Hawkins    Verbal report/handoff to pacu RN, patient/VS stable, NAD. Airway maintained.    Signed by: Jannette Fogo Jency Schnieders  09/21/17 4:07 PM

## 2017-09-22 ENCOUNTER — Encounter: Payer: Self-pay | Admitting: Ophthalmology

## 2017-09-22 NOTE — Anesthesia Postprocedure Evaluation (Signed)
Anesthesia Post Evaluation    Patient: Thomas Hawkins    Procedure(s):  VITRECTOMY, 25 GAUGE, MECHANICAL  VITRECTOMY, ENDO LASER/GAS-FLUID [facility]    Anesthesia type: general    Last Vitals:   Vitals:    09/21/17 1620   BP: 143/86   Pulse: (!) 54   Resp: 16   Temp:    SpO2: 99%       Anesthesia Post Evaluation    Comments: Anesthesia Post Evaluation    Alinda Money    Procedures performed: Procedure(s) with comments:  Procedure(s):  VITRECTOMY, 25 GAUGE, MECHANICAL  VITRECTOMY, ENDO LASER/GAS-FLUID [facility]      Anesthesia type: TIVA  Patient location:Phase II PACU  --------------------            09/21/17               1620     --------------------   BP:       143/86     Pulse:    (!) 54     Resp:       16       Temp:                SpO2:      99%      --------------------        Post pain: Patient not complaining of pain, continue current therapy     Mental Status:awake    Respiratory Function: tolerating room air    Cardiovascular: stable    Nausea/Vomiting: patient not complaining of nausea or vomiting    Hydration Status: adequate    Post assessment: no apparent anesthetic complications              Signed by: Gaylyn Rong, 09/22/2017 7:38 AM

## 2017-09-23 ENCOUNTER — Telehealth: Payer: Self-pay | Admitting: Family Medicine

## 2017-09-23 NOTE — Telephone Encounter (Signed)
Pt of Dr. Tawanna Coolerodd.   Refill request change.   See attached.

## 2017-09-23 NOTE — Telephone Encounter (Signed)
Copied from CRM [id number]. Topic: Quick Communication - Rx Refill/Question >> Sep 23, 2017  3:00 PM Landry MellowFoltz, Melissa J wrote: Medication: hydrocortisone-pramoxine [facility](PROCTOFOAM-HC) rectal foam   Has the patient contacted their pharmacy? Yes.  Pharmacy called    (Agent: If no, request that the patient contact the pharmacy for the refill.)   Preferred Pharmacy (with phone number or street name): [facility] - the foam listed above is very expensive. Pharm would like to know if they can substitute HC 2.5 rectal cream/ please call [phone]   Agent: Please be advised that RX refills may take up to 3 business days. We ask that you follow-up with your pharmacy.

## 2017-09-26 NOTE — Telephone Encounter (Signed)
Spoke with pharmacy and gave a verbal order to substitute the Rx for Proctofoam-HC rectal foam.

## 2017-09-26 NOTE — Telephone Encounter (Signed)
Pharmacy is calling again, requesting call back [phone]

## 2018-06-15 ENCOUNTER — Telehealth: Payer: Self-pay | Admitting: Family Medicine

## 2018-06-15 NOTE — Telephone Encounter (Signed)
Copied from CRM [id number]. Topic: General - Other >> Jun 15, 2018 10:49 AM Marylen Ponto wrote: Reason for CRM: Pt requests labs be drawn prior to his appt for physical on 07/10/18. Pt wants to make sure they do the Digoxin Level and quinidine level at the same time so he doesn't have to get stuck twice. Pt would like the labs ordered and requests a call back to schedule. Cb# [phone] >> Jun 15, 2018  1:01 PM Raj Janus T, CMA wrote: Left a message for a return call.  [facility] staff may tell the pt that it is office policy that no lab work is drawn before yearly physical.  Pt will come in and meet with the provider and a discussion will take place between the provider and pt as to what labs the provider thinks needs to be ordered.

## 2018-06-15 NOTE — Telephone Encounter (Signed)
Pt requesting a call back from Dr. Nelida Meuse assistant to discuss office policy.

## 2018-06-16 NOTE — Telephone Encounter (Signed)
Left message to return phone call.

## 2018-06-19 NOTE — Telephone Encounter (Signed)
Spoke to Jeff Gonzales and advise him that due to insurance purposes lab work for physicals are not done before appt. Jeff Gonzales was upset and stated that he has always gotten it done before appt. Once  Again Jeff Gonzales was advise new rule. Jeff Gonzales wants me to talk to Dr.Todd and let him know what was going on. Jeff Gonzales stated that Dr.Todd has always put them in for him.

## 2018-06-19 NOTE — Telephone Encounter (Signed)
Please advise 

## 2018-06-29 ENCOUNTER — Other Ambulatory Visit: Payer: Self-pay | Admitting: Family Medicine

## 2018-06-29 MED ORDER — DIGOXIN 250 MCG PO TABS
ORAL_TABLET | ORAL | 0 refills | Status: DC
Start: 2018-06-29 — End: 2018-07-10

## 2018-06-29 NOTE — Telephone Encounter (Signed)
Copied from CRM [id number]. Topic: Quick Communication - Rx Refill/Question >> Jun 29, 2018  3:51 PM Raoul Pitch, Sade R wrote: Medication: quiniDINE gluconate 324 MG CR tablet , DIGOX 250 MCG tablet  Has the patient contacted their pharmacy? Yes Preferred Pharmacy (with phone number or street name): [facility] [place], [place] - [address] [phone] (Phone) [phone] (Fax)    Agent: Please be advised that RX refills may take up to 3 business days. We ask that you follow-up with your pharmacy.

## 2018-06-29 NOTE — Telephone Encounter (Signed)
Requested medication (s) are due for refill today: yes  Requested medication (s) are on the active medication list: yes    Last refill: 06/20/17  #540  4 refills  Future visit scheduled yes  07/10/18 with Dr. Tawanna Cooler  Notes to clinic:not delegated  Requested Prescriptions  Pending Prescriptions Disp Refills   quiniDINE gluconate 324 MG CR tablet 540 tablet 4     Not Delegated - Cardiovascular:  Antiarrhythmic Agents Failed - 06/29/2018  3:58 PM      Failed - This refill cannot be delegated      Failed - Mg Level in normal range and within 180 days    Magnesium  Date Value Ref Range Status  03/17/2014 2.1 1.5 - 2.5 mg/dL Final         Failed - K in normal range and within 180 days    Potassium  Date Value Ref Range Status  06/28/2017 4.3 3.5 - 5.1 mEq/L Final         Failed - Ca in normal range and within 180 days    Calcium  Date Value Ref Range Status  06/28/2017 9.4 8.4 - 10.5 mg/dL Final         Failed - AST in normal range and within 180 days    AST  Date Value Ref Range Status  06/28/2017 19 0 - 37 U/L Final         Failed - ALT in normal range and within 180 days    ALT  Date Value Ref Range Status  06/28/2017 19 0 - 53 U/L Final         Failed - Patient had ECG in the last 180 days.      Failed - Valid encounter within last 6 months    Recent Outpatient Visits          11 months ago Atrial fibrillation, unspecified type (HCC)   Adult nurse [facility], Eugenio Hoes, MD   2 years ago Cardiac arrhythmia, unspecified cardiac arrhythmia type   Nature conservation officer at [facility], Eugenio Hoes, MD   3 years ago Atrial fibrillation, unspecified   Nature conservation officer at [facility], Eugenio Hoes, MD   4 years ago Other premature Geologist, engineering at [facility], Eugenio Hoes, MD   4 years ago Atrial fibrillation   Nature conservation officer at [facility], Eugenio Hoes, MD      Future Appointments            In 1 week Tawanna Cooler Eugenio Hoes, MD [facility] at [place], [facility]           Passed - Last BP in normal range    BP Readings from Last 1 Encounters:  07/05/17 136/78         Passed - Last Heart Rate in normal range    Pulse Readings from Last 1 Encounters:  07/05/17 72       Signed Prescriptions Disp Refills   digoxin (DIGOX) 0.25 MG tablet 11 tablet 0    Sig: TAKE 1 TABLET EACH DAY.     Cardiovascular:  Antiarrhythmic Agents - digoxin Failed - 06/29/2018  3:58 PM      Failed - Cr in normal range and within 180 days    Creatinine, Ser  Date Value Ref Range Status  06/28/2017 1.32 0.40 - 1.50 mg/dL Final         Failed - Digoxin (serum) in normal range and within 180 days    Digoxin  Level  Date Value Ref Range Status  06/28/2017 1.2 0.8 - 2.0 mcg/L Final    Comment:    FAB Anti-Digoxin (Digibind(R)) in serum/plasma of  patients under toxicity therapy may interfere with the digoxin immunoassay.          Failed - Ca in normal range and within 180 days    Calcium  Date Value Ref Range Status  06/28/2017 9.4 8.4 - 10.5 mg/dL Final         Failed - K in normal range and within 180 days    Potassium  Date Value Ref Range Status  06/28/2017 4.3 3.5 - 5.1 mEq/L Final         Failed - Valid encounter within last 6 months    Recent Outpatient Visits          11 months ago Atrial fibrillation, unspecified type (HCC)   [facility], Eugenio Hoes, MD   2 years ago Cardiac arrhythmia, unspecified cardiac arrhythmia type   Nature conservation officer at [facility], Eugenio Hoes, MD   3 years ago Atrial fibrillation, unspecified   Nature conservation officer at [facility], Eugenio Hoes, MD   4 years ago Other premature Geologist, engineering at [facility], Eugenio Hoes, MD   4 years ago Atrial fibrillation   Nature conservation officer at [facility], Eugenio Hoes, MD      Future Appointments            In 1 week Roderick Pee, MD [facility] at [place], [facility]            Passed - Last Heart Rate in normal range    Pulse Readings from Last 1 Encounters:  07/05/17 72

## 2018-06-29 NOTE — Telephone Encounter (Signed)
Courtesy refill; Has appt 07/10/18

## 2018-06-30 ENCOUNTER — Other Ambulatory Visit: Payer: Self-pay

## 2018-06-30 MED ORDER — QUINIDINE GLUCONATE ER 324 MG PO TBCR
EXTENDED_RELEASE_TABLET | ORAL | 4 refills | Status: DC
Start: 2018-06-30 — End: 2019-07-26

## 2018-07-10 ENCOUNTER — Encounter: Payer: Self-pay | Admitting: Family Medicine

## 2018-07-10 ENCOUNTER — Ambulatory Visit (INDEPENDENT_AMBULATORY_CARE_PROVIDER_SITE_OTHER): Payer: 59 | Admitting: Family Medicine

## 2018-07-10 VITALS — BP 110/70 | HR 86 | Temp 98.0°F | Ht 70.0 in | Wt 184.9 lb

## 2018-07-10 DIAGNOSIS — R42 Dizziness and giddiness: Secondary | ICD-10-CM

## 2018-07-10 DIAGNOSIS — I4891 Unspecified atrial fibrillation: Secondary | ICD-10-CM | POA: Diagnosis not present

## 2018-07-10 DIAGNOSIS — Z136 Encounter for screening for cardiovascular disorders: Secondary | ICD-10-CM | POA: Diagnosis not present

## 2018-07-10 DIAGNOSIS — Z0000 Encounter for general adult medical examination without abnormal findings: Secondary | ICD-10-CM

## 2018-07-10 LAB — POCT URINALYSIS DIPSTICK
Bilirubin, UA: NEGATIVE
GLUCOSE UA: NEGATIVE
Ketones, UA: NEGATIVE
LEUKOCYTES UA: NEGATIVE
NITRITE UA: NEGATIVE
Odor: NEGATIVE
PROTEIN UA: NEGATIVE
RBC UA: NEGATIVE
SPEC GRAV UA: 1.02 (ref 1.010–1.025)
Urobilinogen, UA: 0.2 E.U./dL
pH, UA: 6.5 (ref 5.0–8.0)

## 2018-07-10 MED ORDER — HYDROCORTISONE 2.5 % RE CREA: 1.0000 "application " | TOPICAL_CREAM | Freq: Two times a day (BID) | RECTAL | 0 refills | Status: DC

## 2018-07-10 MED ORDER — DIGOXIN 250 MCG PO TABS: ORAL_TABLET | ORAL | 4 refills | Status: DC

## 2018-07-10 MED ORDER — LORAZEPAM 0.5 MG PO TABS: ORAL_TABLET | ORAL | 1 refills | Status: DC

## 2018-07-10 MED ORDER — HYDROCORTISONE ACE-PRAMOXINE 1-1 % RE FOAM: 1.0000 | Freq: Two times a day (BID) | RECTAL | 0 refills | Status: DC

## 2018-07-10 MED ORDER — SILDENAFIL CITRATE 20 MG PO TABS: ORAL_TABLET | ORAL | 11 refills | Status: DC

## 2018-07-10 MED ORDER — METOPROLOL TARTRATE 25 MG PO TABS: 12.5000 mg | ORAL_TABLET | Freq: Two times a day (BID) | ORAL | 3 refills | Status: DC | PRN

## 2018-07-10 NOTE — Progress Notes (Signed)
[name] is a 57 year old divorced male non-smoker who comes in today for annual physical examination because of a history of atrial fibrillation  He has had a long-standing history of A. fib.  He is currently asymptomatic and has been in regular sinus rhythm on dig and quinidine.  We have discussed with cardiology coming off the quinidine and dig and converting to another medication however this will have to be done in the hospital over a couple days.  Jeff Gonzales is having no difficulty with his current medicine and we see no need to do that therefore we have continued the dig and quinidine  He is also having pain in his right elbow for couple weeks.  No history of trauma however he does a lot of his own yard work.  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  He and his teammates 1 or came in fifth in international competition for the relay.  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  He is divorced and has no [employer]so at this time.  Vaccinations up-to-date declines a flu shot.  Information given on the shingles vaccine.  He gets routine eye care, dental care, he was supposed to get a colonoscopy last year but nobody ever called them he said  14 point review of system reviewed otherwise negative  For the A. fib he takes Lanoxin 0.25 daily quinidine 2 tabs 3 times daily.  He uses Lopressor 25 mg dose one half tab twice daily as needed for palpitations............ which she has not had to use a long time.  He also has some Ativan he takes as needed for sleep dysfunction which she says is rare maybe 1 a month.  He is got a history with recurrent rectal itching and is used Proctofoam cream in the past and would like a refill  .vs BP 110/70 (BP Location: Right Arm, Patient Position: Sitting, Cuff Size: Large)   Pulse 86   Temp 98 F (36.7 C) (Oral)   Ht 5\' 10"  (1.778 m)   Wt 184 lb 14.4 oz (83.9 kg)   SpO2 98%   BMI 26.53 kg/m  Well-developed will nourished male  no acute distress vital signs stable he is afebrile examination of the head eyes ears nose and throat were negative neck was supple thyroid is not enlarged no carotid bruits.  Cardiopulmonary exam normal abdominal exam normal.  Genitalia normal circumcised male.  Rectal normal stool guaiac negative.  Prostate smooth nonnodular normal size prostate gland  Extremities normal skin normal peripheral pulses normal  1.  Healthy male  2.  History of A. Fib......... continue dig and quinidine  3.  Occasional anxiety..... Ativan as needed  4.  History of hemorrhoids........ Proctofoam cream as needed

## 2018-07-10 NOTE — Patient Instructions (Signed)
Labs today............ I will call if there is anything abnormal  Continue current meds  Thank you for coming to see me all these years.  Good luck to you in the future.  I sent a note to GI about your colonoscopy.  Somebody should call you within the next week.  If you do not hear from any in the next week call them directly at [phone]

## 2018-07-11 LAB — BASIC METABOLIC PANEL
BUN: 19 mg/dL (ref 6–23)
CALCIUM: 9.7 mg/dL (ref 8.4–10.5)
CO2: 29 mEq/L (ref 19–32)
Chloride: 102 mEq/L (ref 96–112)
Creatinine, Ser: 1.23 mg/dL (ref 0.40–1.50)
GFR: 63.94 mL/min (ref >60.00)
Glucose, Bld: 96 mg/dL (ref 70–99)
Potassium: 3.9 mEq/L (ref 3.5–5.1)
SODIUM: 140 meq/L (ref 135–145)

## 2018-07-11 LAB — CBC WITH DIFFERENTIAL/PLATELET
BASOS ABS: 0 10*3/uL (ref 0.0–0.1)
BASOS PCT: 0.4 % (ref 0.0–3.0)
EOS ABS: 0 10*3/uL (ref 0.0–0.7)
Eosinophils Relative: 0.6 % (ref 0.0–5.0)
HCT: 44.7 % (ref 39.0–52.0)
Hemoglobin: 14.8 g/dL (ref 13.0–17.0)
LYMPHS PCT: 12.1 % (ref 12.0–46.0)
Lymphs Abs: 0.8 10*3/uL (ref 0.7–4.0)
MCHC: 33.1 g/dL (ref 30.0–36.0)
MCV: 86.6 fl (ref 78.0–100.0)
MONO ABS: 0.4 10*3/uL (ref 0.1–1.0)
MONOS PCT: 5.2 % (ref 3.0–12.0)
NEUTROS ABS: 5.6 10*3/uL (ref 1.4–7.7)
Neutrophils Relative %: 81.7 % — ABNORMAL HIGH (ref 43.0–77.0)
PLATELETS: 205 10*3/uL (ref 150.0–400.0)
RBC: 5.16 Mil/uL (ref 4.22–5.81)
RDW: 13.7 % (ref 11.5–15.5)
WBC: 6.9 10*3/uL (ref 4.0–10.5)

## 2018-07-11 LAB — HEPATIC FUNCTION PANEL
ALBUMIN: 4.8 g/dL (ref 3.5–5.2)
ALK PHOS: 56 U/L (ref 39–117)
ALT: 16 U/L (ref 0–53)
AST: 17 U/L (ref 0–37)
BILIRUBIN DIRECT: 0.1 mg/dL (ref 0.0–0.3)
TOTAL PROTEIN: 6.9 g/dL (ref 6.0–8.3)
Total Bilirubin: 0.8 mg/dL (ref 0.2–1.2)

## 2018-07-11 LAB — TSH: TSH: 1.54 u[IU]/mL (ref 0.35–4.50)

## 2018-07-11 LAB — LIPID PANEL
CHOL/HDL RATIO: 3
Cholesterol: 157 mg/dL (ref 0–200)
HDL: 50.2 mg/dL (ref >39.00)
LDL CALC: 89 mg/dL (ref 0–99)
NONHDL: 106.38
Triglycerides: 89 mg/dL (ref 0.0–149.0)
VLDL: 17.8 mg/dL (ref 0.0–40.0)

## 2018-07-11 LAB — DIGOXIN LEVEL: Digoxin Level: 1.1 mcg/L (ref 0.8–2.0)

## 2018-07-11 LAB — PSA: PSA: 0.87 ng/mL (ref 0.10–4.00)

## 2018-07-13 LAB — QUINIDINE LEVEL: Quinidine: 1.5 mg/L — ABNORMAL LOW (ref 2.0–5.0)

## 2018-08-11 ENCOUNTER — Encounter: Payer: Self-pay | Admitting: Family Medicine

## 2018-11-23 ENCOUNTER — Telehealth: Payer: Self-pay | Admitting: *Deleted

## 2018-11-23 NOTE — Telephone Encounter (Signed)
Copied from CRM [id number]. Topic: General - Other >> Nov 21, 2018  4:28 PM Jaquita Rector A wrote: Reason for CRM: Patient called to have appointment rescheduled with [facility] but due to the change in his schedule I was not able to change that appointment. Can someone please call him to reschedule since he want to stay with [facility]. He is afraid to come into the office with all the issues about the COVID - 19. Ph# [phone]

## 2018-11-30 ENCOUNTER — Ambulatory Visit (INDEPENDENT_AMBULATORY_CARE_PROVIDER_SITE_OTHER): Payer: 59 | Admitting: Adult Health

## 2018-11-30 ENCOUNTER — Other Ambulatory Visit: Payer: Self-pay

## 2018-11-30 ENCOUNTER — Telehealth: Payer: Self-pay | Admitting: Adult Health

## 2018-11-30 ENCOUNTER — Encounter: Payer: Self-pay | Admitting: Adult Health

## 2018-11-30 DIAGNOSIS — I4891 Unspecified atrial fibrillation: Secondary | ICD-10-CM | POA: Diagnosis not present

## 2018-11-30 DIAGNOSIS — Z7689 Persons encountering health services in other specified circumstances: Secondary | ICD-10-CM | POA: Diagnosis not present

## 2018-11-30 DIAGNOSIS — Z1211 Encounter for screening for malignant neoplasm of colon: Secondary | ICD-10-CM | POA: Diagnosis not present

## 2018-11-30 NOTE — Progress Notes (Signed)
I connected with Jeff Gonzales on 11/30/18 at 10:30 AM EDT by a video enabled telemedicine application and verified that I am speaking with the correct person using two identifiers.  Location patient: home Location provider:work or home office Persons participating in the virtual visit: patient, provider  I discussed the limitations of evaluation and management by telemedicine and the availability of in person appointments. The patient expressed understanding and agreed to proceed.  Patient presents to clinic today to establish care. Pleasant 58 year old male who  has a past medical history of AF (atrial fibrillation) (HCC), HA (headache), Keratosis, actinic, Obstructive sleep apnea (2004), and Vertigo.  He is a former patient of Dr. Tawanna Cooler who last had a CPE in 07/2018    Acute Concerns: Establish Care  Chronic Issues:   A-fib -was diagnosed with atrial fibrillation at the age of 53.  Since that time he has been on digoxin 0.25 mg daily and quinidine 324 mg-2 tablets in the morning, 2 tablets in the afternoon tablets at bedtime.  Dr. Tawanna Cooler had talked to him on numerous occasions about refuring him to cardiology transition him off of these medications to something new.  He has refused in the past because he did not want to be put in the hospital so that he can be monitored while the transition took place.  He denies any chest pain, shortness of breath, or palpitations.  His previous PCP also gave him prescription for metoprolol 12.5 mg twice daily as needed for palpitations  ED-Viagra as needed  Anxiety - takes Ativan 0.5 mg PRN. Last prescribed November 2019 . Reports he can go multiple months without having to take   Health Maintenance:  Dental -- Routine   Vision -- Does not do routine care.  Immunizations -- Will get shingles vaccination at pharmacy  Colonoscopy -- Has never had a colonoscopy d/t not wanting to do prep. 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 AF (atrial fibrillation) (HCC)   . HA (headache)   . Keratosis, actinic    head  . Obstructive sleep apnea 2004   mild osa --AHI 7/hr.  . Vertigo     Past Surgical History:  Procedure Laterality Date  . NECK SURGERY     at 58 years old   . WISDOM TOOTH EXTRACTION      Current Outpatient Medications on File Prior to Visit  Medication Sig Dispense Refill  . aspirin EC 81 MG tablet Take 81 mg by mouth daily.    . digoxin (DIGOX) 0.25 MG tablet TAKE 1 TABLET EACH DAY. 100 tablet 4  . hydrocortisone (ANUSOL-HC) 2.5 % rectal cream Place 1 application rectally 2 (two) times daily. 30 g 0  . hydrocortisone-pramoxine (PROCTOFOAM-HC) rectal foam Place 1 applicator rectally 2 (two) times daily. 10 g 0  . LORazepam (ATIVAN) 0.5 MG tablet 1 tablet daily when necessary 30 tablet 1  . metoprolol tartrate (LOPRESSOR) 25 MG tablet Take 0.5 tablets (12.5 mg total) by mouth 2 (two) times daily as needed. As needed for palpitations 30 tablet 3  . Multiple Vitamin (MULTIVITAMIN WITH MINERALS) TABS tablet Take 1 tablet by mouth daily.    Marland Kitchen quiniDINE gluconate 324 MG CR tablet TAKE 2 TABLETS IN THE MORNING, 2 TABLETS IN THE AFTERNOON, AND 2 TABLETS IN THE EVENING 540 tablet 4  . sildenafil (REVATIO) 20 MG tablet Take 1 to 2 tabs 2 - 3 hours before  sex 20 tablet 11   No current facility-administered medications on file prior to visit.     No Known Allergies  Family History  Problem Relation Age of Onset  . Melanoma Mother     Social History   Socioeconomic History  . Marital status: Divorced    Spouse name: Not on file  . Number of children: Not on file  . Years of education: Not on file  . Highest education level: Not on file  Occupational History  . Not on file  Social Needs  . Financial resource strain: Not on file  . Food insecurity:    Worry: Not on file    Inability: Not on file  . Transportation needs:    Medical: Not on file     Non-medical: Not on file  Tobacco Use  . Smoking status: Former Smoker    Packs/day: 1.50    Last attempt to quit: 08/16/1983    Years since quitting: 35.3  . Smokeless tobacco: Never Used  Substance and Sexual Activity  . Alcohol use: Yes    Alcohol/week: 2.0 standard drinks    Types: 2 Cans of beer per week  . Drug use: No  . Sexual activity: Not on file  Lifestyle  . Physical activity:    Days per week: Not on file    Minutes per session: Not on file  . Stress: Not on file  Relationships  . Social connections:    Talks on phone: Not on file    Gets together: Not on file    Attends religious service: Not on file    Active member of club or organization: Not on file    Attends meetings of clubs or organizations: Not on file    Relationship status: Not on file  . Intimate partner violence:    Fear of current or ex partner: Not on file    Emotionally abused: Not on file    Physically abused: Not on file    Forced sexual activity: Not on file  Other Topics Concern  . Not on file  Social History Narrative  . Not on file    Review of Systems  Constitutional: Negative.   HENT: Negative.   Eyes: Negative.   Respiratory: Negative.   Cardiovascular: Negative.   Gastrointestinal: Negative.   Genitourinary: Negative.   Musculoskeletal: Negative.   Skin: Negative.   Neurological: Negative.   Endo/Heme/Allergies: Negative.   Psychiatric/Behavioral: Negative.   All other systems reviewed and are negative.   There were no vitals taken for this visit.  Physical Exam   VITALS per patient if applicable:  GENERAL: alert, oriented, appears well and in no acute distress  HEENT: atraumatic, conjunttiva clear, no obvious abnormalities on inspection of external nose and ears  NECK: normal movements of the head and neck  LUNGS: on inspection no signs of respiratory distress, breathing rate appears normal, no obvious gross SOB, gasping or wheezing  CV: no obvious cyanosis   MS: moves all visible extremities without noticeable abnormality  PSYCH/NEURO: pleasant and cooperative, no obvious depression or anxiety, speech and thought processing grossly intact  Assessment/Plan:  Discussed the following assessment and plan:  Atrial fibrillation, unspecified type (HCC)  Encounter to establish care  Colon cancer screening - Will order cologuard  I discussed the assessment and treatment plan with the patient. The patient was provided an opportunity to ask questions and all were answered. The patient agreed with the plan and demonstrated an understanding of the instructions.  The patient was advised to call back or seek an in-person evaluation if the symptoms worsen or if the condition fails to improve as anticipated.  I provided 28 minutes of non-face-to-face time during this encounter.   Shirline Frees, NP

## 2018-11-30 NOTE — Telephone Encounter (Signed)
Patient does not need to reschedule. Message left in error.

## 2018-11-30 NOTE — Telephone Encounter (Signed)
LMVM for the patient to make a webex appointment

## 2019-01-02 DIAGNOSIS — Z1212 Encounter for screening for malignant neoplasm of rectum: Secondary | ICD-10-CM | POA: Diagnosis not present

## 2019-01-02 DIAGNOSIS — Z1211 Encounter for screening for malignant neoplasm of colon: Secondary | ICD-10-CM | POA: Diagnosis not present

## 2019-01-02 LAB — COLOGUARD: Cologuard: NEGATIVE

## 2019-01-10 ENCOUNTER — Encounter: Payer: Self-pay | Admitting: Family Medicine

## 2019-07-26 ENCOUNTER — Other Ambulatory Visit: Payer: Self-pay | Admitting: Adult Health

## 2019-07-26 NOTE — Telephone Encounter (Signed)
Requested medication (s) are due for refill today: yes  Requested medication (s) are on the active medication list: yes  Last refill: 06/30/2018  Future visit scheduled: no  Notes to clinic:  Refill cannot be delegated    Requested Prescriptions  Pending Prescriptions Disp Refills   quiniDINE gluconate 324 MG CR tablet 540 tablet 4    Sig: TAKE 2 TABLETS IN THE MORNING, 2 TABLETS IN THE AFTERNOON, AND 2 TABLETS IN THE EVENING     Not Delegated - Cardiovascular:  Antiarrhythmic Agents Failed - 07/26/2019 12:28 PM      Failed - This refill cannot be delegated      Failed - Mg Level in normal range and within 180 days    Magnesium  Date Value Ref Range Status  03/17/2014 2.1 1.5 - 2.5 mg/dL Final         Failed - K in normal range and within 180 days    Potassium  Date Value Ref Range Status  07/10/2018 3.9 3.5 - 5.1 mEq/L Final         Failed - Ca in normal range and within 180 days    Calcium  Date Value Ref Range Status  07/10/2018 9.7 8.4 - 10.5 mg/dL Final         Failed - AST in normal range and within 180 days    AST  Date Value Ref Range Status  07/10/2018 17 0 - 37 U/L Final         Failed - ALT in normal range and within 180 days    ALT  Date Value Ref Range Status  07/10/2018 16 0 - 53 U/L Final         Failed - Patient had ECG in the last 180 days.      Failed - Valid encounter within last 6 months    Recent Outpatient Visits          7 months ago Atrial fibrillation, unspecified type ([place])   Therapist, music at [facility] [employer], Valinda, NP   1 year ago Screening for heart disease   Therapist, music at [place], MD   2 years ago Atrial fibrillation, unspecified type ([place])   Therapist, music at [place], MD   3 years ago Cardiac arrhythmia, unspecified cardiac arrhythmia type   Therapist, music at [facility], Jory Ee, MD   4 years ago Atrial fibrillation, unspecified   Therapist, music at  [employer], Jory Ee, MD             Passed - Last BP in normal range    BP Readings from Last 1 Encounters:  07/10/18 110/70         Passed - Last Heart Rate in normal range    Pulse Readings from Last 1 Encounters:  07/10/18 86

## 2019-07-26 NOTE — Telephone Encounter (Signed)
Copied from [place] [id number]. Topic: Quick Communication - Rx Refill/Question >> Jul 26, 2019 12:13 PM Leward Quan A wrote: Medication: quiniDINE gluconate 324 MG CR tablet   Per patient he only have few pills left   Has the patient contacted their pharmacy? Yes.   (Agent: If no, request that the patient contact the pharmacy for the refill.) (Agent: If yes, when and what did the pharmacy advise?)  Preferred Pharmacy (with phone number or street name): [id number] [address], [place]. [phone] (Phone) [phone] (Fax)    Agent: Please be advised that RX refills may take up to 3 business days. We ask that you follow-up with your pharmacy.

## 2019-07-31 ENCOUNTER — Encounter: Payer: Self-pay | Admitting: Family Medicine

## 2019-07-31 MED ORDER — QUINIDINE GLUCONATE ER 324 MG PO TBCR: EXTENDED_RELEASE_TABLET | ORAL | 0 refills | Status: DC

## 2019-07-31 NOTE — Telephone Encounter (Signed)
Filled for 90 days.  Letter released to MyChart. 

## 2019-07-31 NOTE — Telephone Encounter (Signed)
90 days but needs to get CPE scheduled

## 2019-08-09 ENCOUNTER — Other Ambulatory Visit: Payer: Self-pay | Admitting: Family Medicine

## 2019-08-09 NOTE — Telephone Encounter (Signed)
Ok for 90 days but do for CPE

## 2019-08-10 ENCOUNTER — Encounter: Payer: Self-pay | Admitting: Family Medicine

## 2019-08-10 MED ORDER — DIGOXIN 250 MCG PO TABS: ORAL_TABLET | ORAL | 0 refills | Status: DC

## 2019-08-10 NOTE — Telephone Encounter (Signed)
SENT TO THE PHARMACY BY E-SCRIBE.  LETTER RELEASED TO MYCHART.  NOTIFICATION PLACED IN SIG.

## 2019-09-27 ENCOUNTER — Other Ambulatory Visit: Payer: Self-pay | Admitting: Adult Health

## 2019-09-28 NOTE — Telephone Encounter (Signed)
Patient need to schedule an ov for more refills. 

## 2019-10-18 ENCOUNTER — Other Ambulatory Visit: Payer: Self-pay | Admitting: Adult Health

## 2019-10-18 NOTE — Telephone Encounter (Signed)
Medication Refill:Quinidine, Digoxin [facility]: [facility] [address] [place]  [phone]

## 2019-10-19 MED ORDER — QUINIDINE GLUCONATE ER 324 MG PO TBCR: EXTENDED_RELEASE_TABLET | ORAL | 0 refills | Status: DC

## 2019-10-19 MED ORDER — DIGOXIN 250 MCG PO TABS: ORAL_TABLET | ORAL | 0 refills | Status: DC

## 2019-10-19 NOTE — Telephone Encounter (Signed)
Rx refilled.

## 2019-10-19 NOTE — Telephone Encounter (Signed)
Pt has been scheduled for a physical.   Ok to refill?

## 2019-10-19 NOTE — Telephone Encounter (Signed)
Ok to refill for 90 days  

## 2019-10-23 ENCOUNTER — Encounter: Payer: Self-pay | Admitting: Adult Health

## 2019-10-23 NOTE — Telephone Encounter (Signed)
Please advise 

## 2019-10-23 NOTE — Telephone Encounter (Signed)
I have not seen this yet.

## 2019-12-18 ENCOUNTER — Other Ambulatory Visit: Payer: Self-pay

## 2019-12-19 ENCOUNTER — Encounter: Payer: Self-pay | Admitting: Adult Health

## 2019-12-19 ENCOUNTER — Ambulatory Visit (INDEPENDENT_AMBULATORY_CARE_PROVIDER_SITE_OTHER): Payer: 59 | Admitting: Adult Health

## 2019-12-19 VITALS — BP 120/82 | Temp 97.9°F | Wt 187.0 lb

## 2019-12-19 DIAGNOSIS — I4891 Unspecified atrial fibrillation: Secondary | ICD-10-CM | POA: Diagnosis not present

## 2019-12-19 DIAGNOSIS — Z1159 Encounter for screening for other viral diseases: Secondary | ICD-10-CM

## 2019-12-19 DIAGNOSIS — N521 Erectile dysfunction due to diseases classified elsewhere: Secondary | ICD-10-CM | POA: Diagnosis not present

## 2019-12-19 DIAGNOSIS — F419 Anxiety disorder, unspecified: Secondary | ICD-10-CM

## 2019-12-19 DIAGNOSIS — Z0000 Encounter for general adult medical examination without abnormal findings: Secondary | ICD-10-CM

## 2019-12-19 LAB — COMPREHENSIVE METABOLIC PANEL
ALT: 17 U/L (ref 0–53)
AST: 17 U/L (ref 0–37)
Albumin: 4.6 g/dL (ref 3.5–5.2)
Alkaline Phosphatase: 60 U/L (ref 39–117)
BUN: 24 mg/dL — ABNORMAL HIGH (ref 6–23)
CO2: 28 mEq/L (ref 19–32)
Calcium: 9.2 mg/dL (ref 8.4–10.5)
Chloride: 101 mEq/L (ref 96–112)
Creatinine, Ser: 1.11 mg/dL (ref 0.40–1.50)
GFR: 67.4 mL/min (ref >60.00)
Glucose, Bld: 93 mg/dL (ref 70–99)
Potassium: 3.9 mEq/L (ref 3.5–5.1)
Sodium: 136 mEq/L (ref 135–145)
Total Bilirubin: 0.7 mg/dL (ref 0.2–1.2)
Total Protein: 6.6 g/dL (ref 6.0–8.3)

## 2019-12-19 LAB — CBC WITH DIFFERENTIAL/PLATELET
Basophils Absolute: 0 10*3/uL (ref 0.0–0.1)
Basophils Relative: 0.6 % (ref 0.0–3.0)
Eosinophils Absolute: 0.1 10*3/uL (ref 0.0–0.7)
Eosinophils Relative: 1.8 % (ref 0.0–5.0)
HCT: 43.3 % (ref 39.0–52.0)
Hemoglobin: 14.4 g/dL (ref 13.0–17.0)
Lymphocytes Relative: 22.7 % (ref 12.0–46.0)
Lymphs Abs: 0.8 10*3/uL (ref 0.7–4.0)
MCHC: 33.2 g/dL (ref 30.0–36.0)
MCV: 87.8 fl (ref 78.0–100.0)
Monocytes Absolute: 0.3 10*3/uL (ref 0.1–1.0)
Monocytes Relative: 7.6 % (ref 3.0–12.0)
Neutro Abs: 2.4 10*3/uL (ref 1.4–7.7)
Neutrophils Relative %: 67.3 % (ref 43.0–77.0)
Platelets: 193 10*3/uL (ref 150.0–400.0)
RBC: 4.93 Mil/uL (ref 4.22–5.81)
RDW: 13.7 % (ref 11.5–15.5)
WBC: 3.6 10*3/uL — ABNORMAL LOW (ref 4.0–10.5)

## 2019-12-19 LAB — LIPID PANEL
Cholesterol: 159 mg/dL (ref 0–200)
HDL: 44.3 mg/dL (ref >39.00)
LDL Cholesterol: 100 mg/dL — ABNORMAL HIGH (ref 0–99)
NonHDL: 114.75
Total CHOL/HDL Ratio: 4
Triglycerides: 72 mg/dL (ref 0.0–149.0)
VLDL: 14.4 mg/dL (ref 0.0–40.0)

## 2019-12-19 LAB — TSH: TSH: 2.07 u[IU]/mL (ref 0.35–4.50)

## 2019-12-19 LAB — PSA: PSA: 0.79 ng/mL (ref 0.10–4.00)

## 2019-12-19 MED ORDER — METOPROLOL TARTRATE 25 MG PO TABS: ORAL_TABLET | ORAL | 1 refills | Status: DC

## 2019-12-19 MED ORDER — QUINIDINE GLUCONATE ER 324 MG PO TBCR: EXTENDED_RELEASE_TABLET | ORAL | 3 refills | Status: DC

## 2019-12-19 MED ORDER — LORAZEPAM 0.5 MG PO TABS: ORAL_TABLET | ORAL | 1 refills | Status: DC

## 2019-12-19 MED ORDER — DIGOXIN 250 MCG PO TABS: ORAL_TABLET | ORAL | 3 refills | Status: DC

## 2019-12-19 MED ORDER — SILDENAFIL CITRATE 20 MG PO TABS: ORAL_TABLET | ORAL | 11 refills | Status: DC

## 2019-12-19 NOTE — Progress Notes (Signed)
Subjective:    Patient ID: Jeff Gonzales, male    DOB: 1961-07-27, 59 y.o.   MRN: [id number]  HPI  Patient presents for yearly preventative medicine examination. He is a pleasant 59 year old male who  has a past medical history of AF (atrial fibrillation) (HCC), HA (headache), Keratosis, actinic, Obstructive sleep apnea (2004), and Vertigo.  A-Fib -he was diagnosed with A. fib at the age of 57.  Since that time he has been on digoxin 0.25 mg daily and quinidine 324 mg -two tablets TID.  Previous PCP had talked to him on numerous occasions about referring him to cardiology in order to transition him off these medications onto something new.  He has refused being seen by cardiology in the past because he did not want to be put in the hospital so that he could be monitored while the transition took place.  He denies chest pain, shortness of breath, or palpitations.  Additionally, he has a prescription for metoprolol 12.5 mg twice daily as needed for palpitations  ED- Takes Viagra as needed  Anxiety - Takes Ativan 0.5 mg PRN. His last refill was over a year ago   All immunizations and health maintenance protocols were reviewed with the patient and needed orders were placed. He is up to date on vaccinations.   Appropriate screening laboratory values were ordered for the patient including screening of hyperlipidemia, renal function and hepatic function. If indicated by BPH, a PSA was ordered.  Medication reconciliation,  past medical history, social history, problem list and allergies were reviewed in detail with the patient  Goals were established with regard to weight loss, exercise, and  diet in compliance with medications. He continues to swim a few miles multiple times a week and eats a heart healthy diet.  Wt Readings from Last 3 Encounters:  12/19/19 187 lb (84.8 kg)  07/10/18 184 lb 14.4 oz (83.9 kg)  07/05/17 194 lb (88 kg)    Review of Systems  Constitutional: Negative.   HENT:  Negative.   Eyes: Negative.   Respiratory: Negative.   Cardiovascular: Negative.   Gastrointestinal: Negative.   Endocrine: Negative.   Genitourinary: Negative.   Musculoskeletal: Negative.   Skin: Negative.   Allergic/Immunologic: Negative.   Neurological: Negative.   Hematological: Negative.   Psychiatric/Behavioral: Negative.   All other systems reviewed and are negative.  Past Medical History:  Diagnosis Date  . AF (atrial fibrillation) (HCC)   . HA (headache)   . Keratosis, actinic    head  . Obstructive sleep apnea 2004   mild osa --AHI 7/hr.  . Vertigo     Social History   Socioeconomic History  . Marital status: Divorced    Spouse name: Not on file  . Number of children: Not on file  . Years of education: Not on file  . Highest education level: Not on file  Occupational History  . Not on file  Tobacco Use  . Smoking status: Former Smoker    Packs/day: 1.50    Quit date: 08/16/1983    Years since quitting: 36.3  . Smokeless tobacco: Never Used  Substance and Sexual Activity  . Alcohol use: Yes    Alcohol/week: 2.0 standard drinks    Types: 2 Cans of beer per week  . Drug use: No  . Sexual activity: Not on file  Other Topics Concern  . Not on file  Social History Narrative  . Not on file   Social Determinants of Health  Financial Resource Strain:   . Difficulty of Paying Living Expenses:   Food Insecurity:   . Worried About Programme researcher, broadcasting/film/video in the Last Year:   . Barista in the Last Year:   Transportation Needs:   . Freight forwarder (Medical):   Marland Kitchen Lack of Transportation (Non-Medical):   Physical Activity:   . Days of Exercise per Week:   . Minutes of Exercise per Session:   Stress:   . Feeling of Stress :   Social Connections:   . Frequency of Communication with Friends and Family:   . Frequency of Social Gatherings with Friends and Family:   . Attends Religious Services:   . Active Member of Clubs or Organizations:   .  Attends Banker Meetings:   Marland Kitchen Marital Status:   Intimate Partner Violence:   . Fear of Current or Ex-Partner:   . Emotionally Abused:   Marland Kitchen Physically Abused:   . Sexually Abused:     Past Surgical History:  Procedure Laterality Date  . NECK SURGERY     at 59 years old   . WISDOM TOOTH EXTRACTION      Family History  Problem Relation Age of Onset  . Melanoma Mother     No Known Allergies  Current Outpatient Medications on File Prior to Visit  Medication Sig Dispense Refill  . aspirin EC 81 MG tablet Take 81 mg by mouth daily.    . digoxin (DIGOX) 0.25 MG tablet TAKE 1 TABLET EACH DAY.  **DUE FOR YEARLY PHYSICAL** 90 tablet 0  . hydrocortisone (ANUSOL-HC) 2.5 % rectal cream Place 1 application rectally 2 (two) times daily. 30 g 0  . LORazepam (ATIVAN) 0.5 MG tablet 1 tablet daily when necessary 30 tablet 1  . metoprolol tartrate (LOPRESSOR) 25 MG tablet TAKE 1/2 TABLET (12.5MG ) TWICE DAILY AS NEEDED FOR PALPITATIONS. 30 tablet 0  . Multiple Vitamin (MULTIVITAMIN WITH MINERALS) TABS tablet Take 1 tablet by mouth daily.    Marland Kitchen quiniDINE gluconate 324 MG CR tablet TAKE 2 TABLETS IN THE MORNING, 2 TABLETS IN THE AFTERNOON, AND 2 TABLETS IN THE EVENING 540 tablet 0  . sildenafil (REVATIO) 20 MG tablet Take 1 to 2 tabs 2 - 3 hours before sex 20 tablet 11   No current facility-administered medications on file prior to visit.    BP 120/82   Temp 97.9 F (36.6 C)   Wt 187 lb (84.8 kg)   BMI 26.83 kg/m       Objective:   Physical Exam Vitals and nursing note reviewed.  Constitutional:      General: He is not in acute distress.    Appearance: Normal appearance. He is well-developed and normal weight.  HENT:     Head: Normocephalic and atraumatic.     Right Ear: Tympanic membrane, ear canal and external ear normal. There is no impacted cerumen.     Left Ear: Tympanic membrane, ear canal and external ear normal. There is no impacted cerumen.     Nose: Nose normal. No  congestion or rhinorrhea.     Mouth/Throat:     Mouth: Mucous membranes are moist.     Pharynx: Oropharynx is clear. No oropharyngeal exudate or posterior oropharyngeal erythema.  Eyes:     General:        Right eye: No discharge.        Left eye: No discharge.     Extraocular Movements: Extraocular movements intact.  Conjunctiva/sclera: Conjunctivae normal.     Pupils: Pupils are equal, round, and reactive to light.  Neck:     Vascular: No carotid bruit.     Trachea: No tracheal deviation.  Cardiovascular:     Rate and Rhythm: Normal rate and regular rhythm.     Pulses: Normal pulses.     Heart sounds: Normal heart sounds. No murmur. No friction rub. No gallop.   Pulmonary:     Effort: Pulmonary effort is normal. No respiratory distress.     Breath sounds: Normal breath sounds. No stridor. No wheezing, rhonchi or rales.  Chest:     Chest wall: No tenderness.  Abdominal:     General: Bowel sounds are normal. There is no distension.     Palpations: Abdomen is soft. There is no mass.     Tenderness: There is no abdominal tenderness. There is no right CVA tenderness, left CVA tenderness, guarding or rebound.     Hernia: No hernia is present.  Musculoskeletal:        General: No swelling, tenderness, deformity or signs of injury. Normal range of motion.     Right lower leg: No edema.     Left lower leg: No edema.  Lymphadenopathy:     Cervical: No cervical adenopathy.  Skin:    General: Skin is warm and dry.     Capillary Refill: Capillary refill takes less than 2 seconds.     Coloration: Skin is not jaundiced or pale.     Findings: No bruising, erythema, lesion or rash.  Neurological:     General: No focal deficit present.     Mental Status: He is alert and oriented to person, place, and time.     Cranial Nerves: No cranial nerve deficit.     Sensory: No sensory deficit.     Motor: No weakness.     Coordination: Coordination normal.     Gait: Gait normal.     Deep  Tendon Reflexes: Reflexes normal.  Psychiatric:        Mood and Affect: Mood normal.        Behavior: Behavior normal.        Thought Content: Thought content normal.        Judgment: Judgment normal.       Assessment & Plan:  1. Routine general medical examination at a health care facility - Benign exam. Very healthy 59 year old male  - Follow up in one year or sooner if needed - CBC with Differential/Platelet - Comprehensive metabolic panel - Lipid panel - PSA - TSH  2. Atrial fibrillation, unspecified type (HCC) - normal sinus today  - CBC with Differential/Platelet - Comprehensive metabolic panel - Lipid panel - PSA - TSH - Digoxin level - Quinidine level - digoxin (DIGOX) 0.25 MG tablet; TAKE 1 TABLET EACH DAY.  Dispense: 90 tablet; Refill: 3 - metoprolol tartrate (LOPRESSOR) 25 MG tablet; TAKE 1/2 TABLET (12.5MG ) TWICE DAILY AS NEEDED FOR PALPITATIONS.  Dispense: 30 tablet; Refill: 1 - quiniDINE gluconate 324 MG CR tablet; TAKE 2 TABLETS IN THE MORNING, 2 TABLETS IN THE AFTERNOON, AND 2 TABLETS IN THE EVENING  Dispense: 540 tablet; Refill: 3  3. Anxiety  - LORazepam (ATIVAN) 0.5 MG tablet; 1 tablet daily when necessary  Dispense: 30 tablet; Refill: 1  4. Erectile dysfunction due to diseases classified elsewhere  - CBC with Differential/Platelet - Comprehensive metabolic panel - Lipid panel - PSA - TSH - sildenafil (REVATIO) 20 MG tablet; Take 1  to 2 tabs 2 - 3 hours before sex  Dispense: 20 tablet; Refill: 11  5. Need for hepatitis C screening test  - Hep C Antibody   Dorothyann Peng, NP

## 2019-12-19 NOTE — Patient Instructions (Signed)
It was great seeing you today   We will follow up with you regarding your blood work   Continue to exercise and eat healthy   Health Maintenance, Male A healthy lifestyle and preventative care can promote health and wellness.  Maintain regular health, dental, and eye exams.  Eat a healthy diet. Foods like vegetables, fruits, whole grains, low-fat dairy products, and lean protein foods contain the nutrients you need and are low in calories. Decrease your intake of foods high in solid fats, added sugars, and salt. Get information about a proper diet from your health care provider, if necessary.  Regular physical exercise is one of the most important things you can do for your health. Most adults should get at least 150 minutes of moderate-intensity exercise (any activity that increases your heart rate and causes you to sweat) each week. In addition, most adults need muscle-strengthening exercises on 2 or more days a week.   Maintain a healthy weight. The body mass index (BMI) is a screening tool to identify possible weight problems. It provides an estimate of body fat based on height and weight. Your health care provider can find your BMI and can help you achieve or maintain a healthy weight. For males 20 years and older:  A BMI below 18.5 is considered underweight.  A BMI of 18.5 to 24.9 is normal.  A BMI of 25 to 29.9 is considered overweight.  A BMI of 30 and above is considered obese.  Maintain normal blood lipids and cholesterol by exercising and minimizing your intake of saturated fat. Eat a balanced diet with plenty of fruits and vegetables. Blood tests for lipids and cholesterol should begin at age 58 and be repeated every 5 years. If your lipid or cholesterol levels are high, you are over age 50, or you are at high risk for heart disease, you may need your cholesterol levels checked more frequently.Ongoing high lipid and cholesterol levels should be treated with medicines if diet  and exercise are not working.  If you smoke, find out from your health care provider how to quit. If you do not use tobacco, do not start.  Lung cancer screening is recommended for adults aged 55-80 years who are at high risk for developing lung cancer because of a history of smoking. A yearly low-dose CT scan of the lungs is recommended for people who have at least a 30-pack-year history of smoking and are current smokers or have quit within the past 15 years. A pack year of smoking is smoking an average of 1 pack of cigarettes a day for 1 year (for example, a 30-pack-year history of smoking could mean smoking 1 pack a day for 30 years or 2 packs a day for 15 years). Yearly screening should continue until the smoker has stopped smoking for at least 15 years. Yearly screening should be stopped for people who develop a health problem that would prevent them from having lung cancer treatment.  If you choose to drink alcohol, do not have more than 2 drinks per day. One drink is considered to be 12 oz (360 mL) of beer, 5 oz (150 mL) of wine, or 1.5 oz (45 mL) of liquor.  Avoid the use of street drugs. Do not share needles with anyone. Ask for help if you need support or instructions about stopping the use of drugs.  High blood pressure causes heart disease and increases the risk of stroke. High blood pressure is more likely to develop in:  People  who have blood pressure in the end of the normal range (100-139/85-89 mm Hg).  People who are overweight or obese.  People who are African American.  If you are 23-27 years of age, have your blood pressure checked every 3-5 years. If you are 82 years of age or older, have your blood pressure checked every year. You should have your blood pressure measured twice--once when you are at a hospital or clinic, and once when you are not at a hospital or clinic. Record the average of the two measurements. To check your blood pressure when you are not at a hospital or  clinic, you can use:  An automated blood pressure machine at a pharmacy.  A home blood pressure monitor.  If you are 70-45 years old, ask your health care provider if you should take aspirin to prevent heart disease.  Diabetes screening involves taking a blood sample to check your fasting blood sugar level. This should be done once every 3 years after age 84 if you are at a normal weight and without risk factors for diabetes. Testing should be considered at a younger age or be carried out more frequently if you are overweight and have at least 1 risk factor for diabetes.  Colorectal cancer can be detected and often prevented. Most routine colorectal cancer screening begins at the age of 71 and continues through age 11. However, your health care provider may recommend screening at an earlier age if you have risk factors for colon cancer. On a yearly basis, your health care provider may provide home test kits to check for hidden blood in the stool. A small camera at the end of a tube may be used to directly examine the colon (sigmoidoscopy or colonoscopy) to detect the earliest forms of colorectal cancer. Talk to your health care provider about this at age 24 when routine screening begins. A direct exam of the colon should be repeated every 5-10 years through age 56, unless early forms of precancerous polyps or small growths are found.  People who are at an increased risk for hepatitis B should be screened for this virus. You are considered at high risk for hepatitis B if:  You were born in a country where hepatitis B occurs often. Talk with your health care provider about which countries are considered high risk.  Your parents were born in a high-risk country and you have not received a shot to protect against hepatitis B (hepatitis B vaccine).  You have HIV or AIDS.  You use needles to inject street drugs.  You live with, or have sex with, someone who has hepatitis B.  You are a man who has  sex with other men (MSM).  You get hemodialysis treatment.  You take certain medicines for conditions like cancer, organ transplantation, and autoimmune conditions.  Hepatitis C blood testing is recommended for all people born from [birth year] through 1967 and any individual with known risk factors for hepatitis C.  Healthy men should no longer receive prostate-specific antigen (PSA) blood tests as part of routine cancer screening. Talk to your health care provider about prostate cancer screening.  Testicular cancer screening is not recommended for adolescents or adult males who have no symptoms. Screening includes self-exam, a health care provider exam, and other screening tests. Consult with your health care provider about any symptoms you have or any concerns you have about testicular cancer.  Practice safe sex. Use condoms and avoid high-risk sexual practices to reduce the spread of sexually  transmitted infections (STIs).  You should be screened for STIs, including gonorrhea and chlamydia if:  You are sexually active and are younger than 24 years.  You are older than 24 years, and your health care provider tells you that you are at risk for this type of infection.  Your sexual activity has changed since you were last screened, and you are at an increased risk for chlamydia or gonorrhea. Ask your health care provider if you are at risk.  If you are at risk of being infected with HIV, it is recommended that you take a prescription medicine daily to prevent HIV infection. This is called pre-exposure prophylaxis (PrEP). You are considered at risk if:  You are a man who has sex with other men (MSM).  You are a heterosexual man who is sexually active with multiple partners.  You take drugs by injection.  You are sexually active with a partner who has HIV.  Talk with your health care provider about whether you are at high risk of being infected with HIV. If you choose to begin PrEP, you should  first be tested for HIV. You should then be tested every 3 months for as long as you are taking PrEP.  Use sunscreen. Apply sunscreen liberally and repeatedly throughout the day. You should seek shade when your shadow is shorter than you. Protect yourself by wearing long sleeves, pants, a wide-brimmed hat, and sunglasses year round whenever you are outdoors.  Tell your health care provider of new moles or changes in moles, especially if there is a change in shape or color. Also, tell your health care provider if a mole is larger than the size of a pencil eraser.  A one-time screening for abdominal aortic aneurysm (AAA) and surgical repair of large AAAs by ultrasound is recommended for men aged 35-75 years who are current or former smokers.  Stay current with your vaccines (immunizations).   This information is not intended to replace advice given to you by your health care provider. Make sure you discuss any questions you have with your health care provider.   Document Released: 02/19/2008 Document Revised: 09/13/2014 Document Reviewed: 01/18/2011 Elsevier Interactive Patient Education [employer].

## 2019-12-23 LAB — HEPATITIS C ANTIBODY
Hepatitis C Ab: NONREACTIVE
SIGNAL TO CUT-OFF: 0.02 (ref <1.00)

## 2019-12-23 LAB — DIGOXIN LEVEL: Digoxin Level: 1.4 mcg/L (ref 0.8–2.0)

## 2019-12-23 LAB — QUINIDINE LEVEL: Quinidine: 2.1 mg/L (ref 2.0–5.0)

## 2020-01-18 ENCOUNTER — Other Ambulatory Visit: Payer: Self-pay | Admitting: Adult Health

## 2020-01-18 DIAGNOSIS — I4891 Unspecified atrial fibrillation: Secondary | ICD-10-CM

## 2020-04-04 ENCOUNTER — Other Ambulatory Visit: Payer: Self-pay | Admitting: Adult Health

## 2020-04-04 DIAGNOSIS — I4891 Unspecified atrial fibrillation: Secondary | ICD-10-CM

## 2020-04-08 NOTE — Telephone Encounter (Signed)
DENIED.  SENT IN FOR 1 YEAR ON 12/19/2019.

## 2020-05-04 ENCOUNTER — Other Ambulatory Visit: Payer: Self-pay | Admitting: Adult Health

## 2020-05-04 DIAGNOSIS — I4891 Unspecified atrial fibrillation: Secondary | ICD-10-CM

## 2020-07-30 ENCOUNTER — Encounter: Payer: Self-pay | Admitting: Family Medicine

## 2020-07-30 ENCOUNTER — Other Ambulatory Visit: Payer: Self-pay

## 2020-07-30 ENCOUNTER — Ambulatory Visit
Admission: EM | Admit: 2020-07-30 | Discharge: 2020-07-30 | Disposition: A | Discharge status: Home / Self Care | Payer: 59 | Attending: Family Medicine | Admitting: Family Medicine

## 2020-07-30 DIAGNOSIS — K219 Gastro-esophageal reflux disease without esophagitis: Secondary | ICD-10-CM

## 2020-07-30 MED ORDER — FAMOTIDINE 20 MG PO TABS: 20.0000 mg | ORAL_TABLET | Freq: Two times a day (BID) | ORAL | 0 refills | Status: AC

## 2020-07-30 NOTE — ED Triage Notes (Signed)
Pt c/o burning, warm, and tightness to throat since Sunday. States laid off wine, coffee, and chocolate on Monday and it helped some but having same feeling today.

## 2020-07-30 NOTE — ED Provider Notes (Signed)
[facility]    CSN: [id number] Arrival date & time: 07/30/20  1621      History   Chief Complaint Chief Complaint  Patient presents with  . Sore Throat    HPI Jeff Gonzales is a 59 y.o. male.   Initial [facility] visit for this 59 yo man complaining of throat discomfort.  This began 4 days ago for unexplained reasons.  Patient has regular routine of swimming and diet.  He did change the routine last week because of tooth problem, though.  Throat feels extra sensitive to heated food.     Past Medical History:  Diagnosis Date  . AF (atrial fibrillation) (HCC)   . HA (headache)   . Keratosis, actinic    head  . Obstructive sleep apnea 2004   mild osa --AHI 7/hr.  . Vertigo     Patient Active Problem List   Diagnosis Date Noted  . Routine general medical examination at a health care facility 06/09/2016  . Cardiac arrhythmia 03/18/2014  . SLEEP APNEA, OBSTRUCTIVE 06/03/2010  . ATRIAL FIBRILLATION 04/28/2007    Past Surgical History:  Procedure Laterality Date  . NECK SURGERY     at 59 years old   . WISDOM TOOTH EXTRACTION         Home Medications    Prior to Admission medications   Medication Sig Start Date End Date Taking? Authorizing Provider  aspirin EC 81 MG tablet Take 81 mg by mouth daily.    [provider]  digoxin (LANOXIN) 0.25 MG tablet TAKE 1 TABLET BY MOUTH EVERY DAY 05/06/20   Nafziger, Kandee Keen, NP  famotidine (PEPCID) 20 MG tablet Take 1 tablet (20 mg total) by mouth 2 (two) times daily. 07/30/20   Elvina Sidle, MD  hydrocortisone (ANUSOL-HC) 2.5 % rectal cream Place 1 application rectally 2 (two) times daily. 07/10/18   Roderick Pee, MD  LORazepam (ATIVAN) 0.5 MG tablet 1 tablet daily when necessary 12/19/19   Shirline Frees, NP  metoprolol tartrate (LOPRESSOR) 25 MG tablet TAKE 1/2 TABLET (12.5MG ) TWICE DAILY AS NEEDED FOR PALPITATIONS. 01/22/20   Shirline Frees, NP  Multiple Vitamin (MULTIVITAMIN WITH MINERALS) TABS tablet  Take 1 tablet by mouth daily.    [provider]  quiniDINE gluconate 324 MG CR tablet TAKE 2 TABLETS IN THE MORNING, 2 TABLETS IN THE AFTERNOON, AND 2 TABLETS IN THE EVENING 12/19/19   Nafziger, Kandee Keen, NP  sildenafil (REVATIO) 20 MG tablet Take 1 to 2 tabs 2 - 3 hours before sex 12/19/19   Shirline Frees, NP    Family History Family History  Problem Relation Age of Onset  . Melanoma Mother     Social History Social History   Tobacco Use  . Smoking status: Former Smoker    Packs/day: 1.50    Quit date: 08/16/1983    Years since quitting: 36.9  . Smokeless tobacco: Never Used  Substance Use Topics  . Alcohol use: Yes    Alcohol/week: 2.0 standard drinks    Types: 2 Cans of beer per week  . Drug use: No     Allergies   Patient has no known allergies.   Review of Systems Review of Systems   Physical Exam Triage Vital Signs ED Triage Vitals  Enc Vitals Group     BP      Pulse      Resp      Temp      Temp src      SpO2  Weight      Height      Head Circumference      Peak Flow      Pain Score      Pain Loc      Pain Edu?      Excl. in GC?    No data found.  Updated Vital Signs BP (!) 142/90 (BP Location: Left Arm)   Pulse 95   Temp 98 F (36.7 C) (Oral)   Resp 18   SpO2 97%    Physical Exam Vitals and nursing note reviewed.  Constitutional:      Appearance: He is well-developed and normal weight.  HENT:     Mouth/Throat:     Mouth: Mucous membranes are moist.     Pharynx: Oropharynx is clear. Posterior oropharyngeal erythema present.     Tonsils: No tonsillar exudate or tonsillar abscesses.  Eyes:     Conjunctiva/sclera: Conjunctivae normal.  Cardiovascular:     Rate and Rhythm: Normal rate.  Pulmonary:     Effort: Pulmonary effort is normal.  Musculoskeletal:     Cervical back: Normal range of motion and neck supple.  Lymphadenopathy:     Cervical: No cervical adenopathy.  Skin:    General: Skin is warm and dry.    Neurological:     General: No focal deficit present.     Mental Status: He is alert.  Psychiatric:        Mood and Affect: Mood normal.      UC Treatments / Results  Labs (all labs ordered are listed, but only abnormal results are displayed) Labs Reviewed - No data to display  EKG   Radiology No results found.  Procedures Procedures (including critical care time)  Medications Ordered in UC Medications - No data to display  Initial Impression / Assessment and Plan / UC Course  I have reviewed the triage vital signs and the nursing notes.  Pertinent labs & imaging results that were available during my care of the patient were reviewed by me and considered in my medical decision making (see chart for details).    Final Clinical Impressions(s) / UC Diagnoses   Final diagnoses:  Gastroesophageal reflux disease without esophagitis     Discharge Instructions     You should see resolution of this over the next 72 hours.    ED Prescriptions    Medication Sig Dispense Auth. Provider   famotidine (PEPCID) 20 MG tablet Take 1 tablet (20 mg total) by mouth 2 (two) times daily. 30 tablet Elvina Sidle, MD     I have reviewed the PDMP during this encounter.   Elvina Sidle, MD 07/30/20 1649

## 2020-07-30 NOTE — Discharge Instructions (Addendum)
You should see resolution of this over the next 72 hours.

## 2021-01-06 ENCOUNTER — Other Ambulatory Visit: Payer: Self-pay | Admitting: Adult Health

## 2021-01-06 DIAGNOSIS — I4891 Unspecified atrial fibrillation: Secondary | ICD-10-CM

## 2021-01-08 ENCOUNTER — Encounter: Payer: Self-pay | Admitting: Adult Health

## 2021-01-08 ENCOUNTER — Ambulatory Visit (INDEPENDENT_AMBULATORY_CARE_PROVIDER_SITE_OTHER): Payer: 59 | Admitting: Adult Health

## 2021-01-08 ENCOUNTER — Other Ambulatory Visit: Payer: Self-pay

## 2021-01-08 VITALS — BP 132/80 | HR 67 | Temp 98.0°F | Ht 71.0 in | Wt 192.4 lb

## 2021-01-08 DIAGNOSIS — I4891 Unspecified atrial fibrillation: Secondary | ICD-10-CM

## 2021-01-08 DIAGNOSIS — Z0000 Encounter for general adult medical examination without abnormal findings: Secondary | ICD-10-CM

## 2021-01-08 DIAGNOSIS — Z125 Encounter for screening for malignant neoplasm of prostate: Secondary | ICD-10-CM

## 2021-01-08 DIAGNOSIS — F419 Anxiety disorder, unspecified: Secondary | ICD-10-CM | POA: Diagnosis not present

## 2021-01-08 DIAGNOSIS — N521 Erectile dysfunction due to diseases classified elsewhere: Secondary | ICD-10-CM | POA: Diagnosis not present

## 2021-01-08 LAB — CBC WITH DIFFERENTIAL/PLATELET
Basophils Absolute: 0 10*3/uL (ref 0.0–0.1)
Basophils Relative: 0.8 % (ref 0.0–3.0)
Eosinophils Absolute: 0.1 10*3/uL (ref 0.0–0.7)
Eosinophils Relative: 2.3 % (ref 0.0–5.0)
HCT: 44.9 % (ref 39.0–52.0)
Hemoglobin: 15 g/dL (ref 13.0–17.0)
Lymphocytes Relative: 19.1 % (ref 12.0–46.0)
Lymphs Abs: 0.7 10*3/uL (ref 0.7–4.0)
MCHC: 33.3 g/dL (ref 30.0–36.0)
MCV: 85.9 fl (ref 78.0–100.0)
Monocytes Absolute: 0.3 10*3/uL (ref 0.1–1.0)
Monocytes Relative: 8.6 % (ref 3.0–12.0)
Neutro Abs: 2.4 10*3/uL (ref 1.4–7.7)
Neutrophils Relative %: 69.2 % (ref 43.0–77.0)
Platelets: 184 10*3/uL (ref 150.0–400.0)
RBC: 5.23 Mil/uL (ref 4.22–5.81)
RDW: 13.5 % (ref 11.5–15.5)
WBC: 3.5 10*3/uL — ABNORMAL LOW (ref 4.0–10.5)

## 2021-01-08 LAB — COMPREHENSIVE METABOLIC PANEL
ALT: 20 U/L (ref 0–53)
AST: 18 U/L (ref 0–37)
Albumin: 4.6 g/dL (ref 3.5–5.2)
Alkaline Phosphatase: 62 U/L (ref 39–117)
BUN: 24 mg/dL — ABNORMAL HIGH (ref 6–23)
CO2: 28 mEq/L (ref 19–32)
Calcium: 9.4 mg/dL (ref 8.4–10.5)
Chloride: 103 mEq/L (ref 96–112)
Creatinine, Ser: 1.28 mg/dL (ref 0.40–1.50)
GFR: 60.27 mL/min (ref >60.00)
Glucose, Bld: 96 mg/dL (ref 70–99)
Potassium: 4.1 mEq/L (ref 3.5–5.1)
Sodium: 139 mEq/L (ref 135–145)
Total Bilirubin: 1 mg/dL (ref 0.2–1.2)
Total Protein: 6.8 g/dL (ref 6.0–8.3)

## 2021-01-08 LAB — LIPID PANEL
Cholesterol: 152 mg/dL (ref 0–200)
HDL: 47.2 mg/dL (ref >39.00)
LDL Cholesterol: 89 mg/dL (ref 0–99)
NonHDL: 104.73
Total CHOL/HDL Ratio: 3
Triglycerides: 81 mg/dL (ref 0.0–149.0)
VLDL: 16.2 mg/dL (ref 0.0–40.0)

## 2021-01-08 LAB — TSH: TSH: 2.38 u[IU]/mL (ref 0.35–4.50)

## 2021-01-08 LAB — PSA: PSA: 2.97 ng/mL (ref 0.10–4.00)

## 2021-01-08 MED ORDER — HYDROCORTISONE (PERIANAL) 2.5 % EX CREA: 1.0000 "application " | TOPICAL_CREAM | Freq: Every day | CUTANEOUS | 0 refills | Status: DC | PRN

## 2021-01-08 MED ORDER — DIGOXIN 250 MCG PO TABS: ORAL_TABLET | ORAL | 3 refills | Status: DC

## 2021-01-08 MED ORDER — SILDENAFIL CITRATE 20 MG PO TABS: ORAL_TABLET | ORAL | 11 refills | Status: DC

## 2021-01-08 MED ORDER — METOPROLOL TARTRATE 25 MG PO TABS
ORAL_TABLET | ORAL | 1 refills | Status: DC
Start: 2021-01-08 — End: 2022-08-04

## 2021-01-08 MED ORDER — QUINIDINE GLUCONATE ER 324 MG PO TBCR: EXTENDED_RELEASE_TABLET | ORAL | 3 refills | Status: DC

## 2021-01-08 MED ORDER — LORAZEPAM 0.5 MG PO TABS: ORAL_TABLET | ORAL | 1 refills | Status: DC

## 2021-01-08 NOTE — Progress Notes (Signed)
Subjective:    Patient ID: Jeff Gonzales, male    DOB: 08-Jul-1961, 60 y.o.   MRN: [id number]  HPI Patient presents for yearly preventative medicine examination. He is a pleasant 60 year old male who  has a past medical history of AF (atrial fibrillation) (HCC), HA (headache), Keratosis, actinic, Obstructive sleep apnea (2004), and Vertigo.  A- fib -diagnosed at the age of 37.  Since that time he has been on digoxin 0.25 mg daily and quinidine 324 mg- 2 tablets 3 times daily.  Past he has fused to see cardiology to transition him off the older medications for A. fib onto something new.  He denies chest pain, shortness of breath or palpitations.  He does have a prescription for metoprolol 12.5 mg twice daily as needed for palpitations has not had to take this   ED - takes Viagra as needed  Anxiety - uses Ativan 0.5 mg PRN. Last refill was a year ago.   All immunizations and health maintenance protocols were reviewed with the patient and needed orders were placed.  Appropriate screening laboratory values were ordered for the patient including screening of hyperlipidemia, renal function and hepatic function. If indicated by BPH, a PSA was ordered.  Medication reconciliation,  past medical history, social history, problem list and allergies were reviewed in detail with the patient  Goals were established with regard to weight loss, exercise, and  diet in compliance with medications. He stays very active with swimming and eat a heart healthy diet.  Wt Readings from Last 3 Encounters:  12/19/19 187 lb (84.8 kg)  07/10/18 184 lb 14.4 oz (83.9 kg)  07/05/17 194 lb (88 kg)    He is up to date on routine colon cancer screening   Denies any acute complaints    Review of Systems  Constitutional: Negative.   HENT: Negative.   Eyes: Negative.   Respiratory: Negative.   Cardiovascular: Negative.   Gastrointestinal: Negative.   Endocrine: Negative.   Genitourinary: Negative.    Musculoskeletal: Negative.   Skin: Negative.   Allergic/Immunologic: Negative.   Neurological: Negative.   Hematological: Negative.   Psychiatric/Behavioral: Negative.   All other systems reviewed and are negative.    Past Medical History:  Diagnosis Date  . AF (atrial fibrillation) (HCC)   . HA (headache)   . Keratosis, actinic    head  . Obstructive sleep apnea 2004   mild osa --AHI 7/hr.  . Vertigo     Social History   Socioeconomic History  . Marital status: Divorced    Spouse name: Not on file  . Number of children: Not on file  . Years of education: Not on file  . Highest education level: Not on file  Occupational History  . Not on file  Tobacco Use  . Smoking status: Former Smoker    Packs/day: 1.50    Quit date: 08/16/1983    Years since quitting: 37.4  . Smokeless tobacco: Never Used  Substance and Sexual Activity  . Alcohol use: Yes    Alcohol/week: 2.0 standard drinks    Types: 2 Cans of beer per week  . Drug use: No  . Sexual activity: Not on file  Other Topics Concern  . Not on file  Social History Narrative  . Not on file   Social Determinants of Health   Financial Resource Strain: Not on file  Food Insecurity: Not on file  Transportation Needs: Not on file  Physical Activity: Not on file  Stress:  Not on file  Social Connections: Not on file  Intimate Partner Violence: Not on file    Past Surgical History:  Procedure Laterality Date  . NECK SURGERY     at 60 years old   . WISDOM TOOTH EXTRACTION      Family History  Problem Relation Age of Onset  . Melanoma Mother     No Known Allergies  Current Outpatient Medications on File Prior to Visit  Medication Sig Dispense Refill  . aspirin EC 81 MG tablet Take 81 mg by mouth daily.    . digoxin (LANOXIN) 0.25 MG tablet TAKE 1 TABLET BY MOUTH EVERY DAY 90 tablet 2  . famotidine (PEPCID) 20 MG tablet Take 1 tablet (20 mg total) by mouth 2 (two) times daily. 30 tablet 0  .  hydrocortisone (ANUSOL-HC) 2.5 % rectal cream Place 1 application rectally 2 (two) times daily. 30 g 0  . LORazepam (ATIVAN) 0.5 MG tablet 1 tablet daily when necessary 30 tablet 1  . metoprolol tartrate (LOPRESSOR) 25 MG tablet TAKE 1/2 TABLET (12.5MG ) TWICE DAILY AS NEEDED FOR PALPITATIONS. 90 tablet 1  . Multiple Vitamin (MULTIVITAMIN WITH MINERALS) TABS tablet Take 1 tablet by mouth daily.    Marland Kitchen quiniDINE gluconate 324 MG CR tablet TAKE 2 TABLETS IN THE MORNING, 2 TABLETS IN THE AFTERNOON, AND 2 TABLETS IN THE EVENING 540 tablet 3  . sildenafil (REVATIO) 20 MG tablet Take 1 to 2 tabs 2 - 3 hours before sex 20 tablet 11   No current facility-administered medications on file prior to visit.    BP 132/80 (BP Location: Left Arm, Patient Position: Sitting, Cuff Size: Normal)   Pulse 67   Temp 98 F (36.7 C) (Oral)   Ht 5\' 11"  (1.803 m)   Wt 192 lb 6.4 oz (87.3 kg)   SpO2 98%   BMI 26.83 kg/m       Objective:   Physical Exam Vitals and nursing note reviewed.  Constitutional:      General: He is not in acute distress.    Appearance: Normal appearance. He is well-developed and normal weight.  HENT:     Head: Normocephalic and atraumatic.     Right Ear: Tympanic membrane, ear canal and external ear normal. There is no impacted cerumen.     Left Ear: Tympanic membrane, ear canal and external ear normal. There is no impacted cerumen.     Nose: Nose normal. No congestion or rhinorrhea.     Mouth/Throat:     Mouth: Mucous membranes are moist.     Pharynx: Oropharynx is clear. No oropharyngeal exudate or posterior oropharyngeal erythema.  Eyes:     General:        Right eye: No discharge.        Left eye: No discharge.     Extraocular Movements: Extraocular movements intact.     Conjunctiva/sclera: Conjunctivae normal.     Pupils: Pupils are equal, round, and reactive to light.  Neck:     Vascular: No carotid bruit.     Trachea: No tracheal deviation.  Cardiovascular:     Rate and  Rhythm: Normal rate and regular rhythm.     Pulses: Normal pulses.     Heart sounds: Normal heart sounds. No murmur heard. No friction rub. No gallop.   Pulmonary:     Effort: Pulmonary effort is normal. No respiratory distress.     Breath sounds: Normal breath sounds. No stridor. No wheezing, rhonchi or rales.  Chest:  Chest wall: No tenderness.  Abdominal:     General: Bowel sounds are normal. There is no distension.     Palpations: Abdomen is soft. There is no mass.     Tenderness: There is no abdominal tenderness. There is no right CVA tenderness, left CVA tenderness, guarding or rebound.     Hernia: No hernia is present.  Musculoskeletal:        General: No swelling, tenderness, deformity or signs of injury. Normal range of motion.     Right lower leg: No edema.     Left lower leg: No edema.  Lymphadenopathy:     Cervical: No cervical adenopathy.  Skin:    General: Skin is warm and dry.     Capillary Refill: Capillary refill takes less than 2 seconds.     Coloration: Skin is not jaundiced or pale.     Findings: No bruising, erythema, lesion or rash.  Neurological:     Mental Status: He is alert and oriented to person, place, and time.     Cranial Nerves: No cranial nerve deficit.     Sensory: No sensory deficit.     Motor: No weakness.     Coordination: Coordination normal.     Gait: Gait normal.     Deep Tendon Reflexes: Reflexes normal.  Psychiatric:        Mood and Affect: Mood normal.        Behavior: Behavior normal.        Thought Content: Thought content normal.        Judgment: Judgment normal.       Assessment & Plan:  1. Routine general medical examination at a health care facility - Benign exam. Very healthy 60 year old male  - CBC with Differential/Platelet; Future - Comprehensive metabolic panel; Future - Lipid panel; Future - TSH; Future - CBC with Differential/Platelet - Lipid panel - TSH - Comprehensive metabolic panel  2. Atrial  fibrillation, unspecified type (HCC)  - Quinidine level; Future - Digoxin level; Future - CBC with Differential/Platelet; Future - Comprehensive metabolic panel; Future - Lipid panel; Future - TSH; Future - digoxin (LANOXIN) 0.25 MG tablet; TAKE 1 TABLET BY MOUTH EVERY DAY  Dispense: 90 tablet; Refill: 3 - metoprolol tartrate (LOPRESSOR) 25 MG tablet; TAKE 1/2 TABLET (12.5MG ) TWICE DAILY AS NEEDED FOR PALPITATIONS.  Dispense: 90 tablet; Refill: 1 - quiniDINE gluconate 324 MG CR tablet; TAKE 2 TABLETS IN THE MORNING, 2 TABLETS IN THE AFTERNOON, AND 2 TABLETS IN THE EVENING  Dispense: 540 tablet; Refill: 3 - CBC with Differential/Platelet - Lipid panel - TSH - Comprehensive metabolic panel - Digoxin level - Quinidine level  3. Anxiety  - LORazepam (ATIVAN) 0.5 MG tablet; 1 tablet daily when necessary  Dispense: 30 tablet; Refill: 1  4. Erectile dysfunction due to diseases classified elsewhere  - sildenafil (REVATIO) 20 MG tablet; Take 1 to 2 tabs 2 - 3 hours before sex  Dispense: 20 tablet; Refill: 11  5. Prostate cancer screening  - PSA; Future - PSA  Shirline Frees, NP

## 2021-01-10 LAB — DIGOXIN LEVEL: Digoxin Level: 1.3 mcg/L (ref 0.8–2.0)

## 2021-01-10 LAB — QUINIDINE LEVEL: Quinidine: 2.4 mg/L (ref 2.0–5.0)

## 2021-08-10 ENCOUNTER — Telehealth: Payer: Self-pay | Admitting: Adult Health

## 2021-08-10 NOTE — Telephone Encounter (Signed)
Dr. Gordy Clement is a doctor from [facility] and says that he would like to touch base with Kandee Keen about some medications for the patient.He says that he is prescribing the patient temazepam for sleep and sees that Kandee Keen has prescribed the patient lorazepam as well.  Dr. Gordy Clement can be reached at [phone]. He says that is his back line.

## 2021-08-12 NOTE — Telephone Encounter (Signed)
Please advise 

## 2021-10-07 ENCOUNTER — Other Ambulatory Visit: Payer: Self-pay | Admitting: Adult Health

## 2021-10-07 DIAGNOSIS — I4891 Unspecified atrial fibrillation: Secondary | ICD-10-CM

## 2021-12-22 ENCOUNTER — Other Ambulatory Visit: Payer: Self-pay

## 2021-12-22 ENCOUNTER — Ambulatory Visit: Payer: BC Managed Care – PPO

## 2021-12-22 DIAGNOSIS — H33012 Retinal detachment with single break, left eye: Secondary | ICD-10-CM | POA: Insufficient documentation

## 2021-12-22 NOTE — PSS Phone Screening (Signed)
Pre-Anesthesia Evaluation    Pre-op phone visit requested by:   Reason for pre-op phone visit: Patient anticipating VITRECTOMY, Retina Detachment Repair, Left Eye, 25 Gauge, VITRECTOMY, ENDO LASER/GAS-FLUID [facility] procedure.    No orders of the defined types were placed in this encounter.      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   12/22/2021    Overview Signed 12/22/2021 12:49 PM by Mabeline Caras     Added automatically from request for surgery [phone]             Medical History   Diagnosis Date    Abnormal vision 2017    right eye retinal detachment    Bilateral cataracts     left eye - being watched    Disorder of musculoskeletal system     occassional muscle spasms low back, occassional neck pain from a whiplash injury    Diverticulitis 2015    one episode    Ear, nose and throat disorder     allergic rhinitis    Hypertension     under control with medication per pt    Sleep apnea     Hx in past-no longer requires CPAP due to weight loss     Past Surgical History:   Procedure Laterality Date    COLONOSCOPY      RETINAL DETACHMENT SURGERY Right 2017    VITRECTOMY, 25 GAUGE, MECHANICAL Right 09/21/2017    Procedure: VITRECTOMY, 25 GAUGE, MECHANICAL;  Surgeon: Theda Belfast, MD;  Location: [place] MAIN OR;  Service: Ophthalmology;  Laterality: Right;    VITRECTOMY, ENDO LASER/GAS-FLUID [facility] Right 09/21/2017    Procedure: VITRECTOMY, ENDO LASER/GAS-FLUID EXCH;  Surgeon: Theda Belfast, MD;  Location: [place] MAIN OR;  Service: Ophthalmology;  Laterality: Right;        Medication List            Accurate as of December 22, 2021  2:13 PM. Always use your most recent med list.                cetirizine 10 MG tablet  Take 10 mg by mouth nightly.  Commonly  known as: ZyrTEC  Medication Adjustments for Surgery: Take as prescribed     FLAXSEED OIL PO  Take 1 capsule by mouth daily  Medication Adjustments for Surgery: Stop now     fluticasone 50 MCG/ACT nasal spray  1 spray by Nasal route every morning  Commonly known as: FLONASE  Medication Adjustments for Surgery: Take morning of surgery     lisinopril 10 MG tablet  Take 10 mg by mouth every morning  Commonly known as: ZESTRIL  Medication Adjustments for Surgery: Take morning of surgery     temazepam 15 MG capsule  Take 15 mg by mouth nightly  Commonly known as: RESTORIL  Medication Adjustments for Surgery: Take as prescribed            No Known Allergies  History reviewed. No pertinent family history.  Social History     Occupational History    Not on file   Tobacco Use    Smoking status: Former  Types: Cigarettes     Quit date: 09/20/1978     Years since quitting: 43.2    Smokeless tobacco: Never   Vaping Use    Vaping status: Never Used   Substance and Sexual Activity    Alcohol use: Yes     Alcohol/week: 14.0 standard drinks of alcohol     Types: 14 Glasses of wine per week    Drug use: No    Sexual activity: Not on file             Exam Scores:   SDB score      PONV score      MST score      Allergy score      Frailty score         Visit Vitals  Ht 1.88 m (6\' 2" )   Wt 100.7 kg (222 lb)   BMI 28.50 kg/m

## 2021-12-22 NOTE — Pre-Procedure Instructions (Signed)
Important Instructions Before Your Procedure        Your case is currently scheduled for 12/23/2021 with Huang, Jason M, MD.      The date and/or time of your surgery may change.  Your surgeon's office will notify you, up until the business day before surgery, if there is any change to your surgery date or time.  Please don't hesitate to call your surgeon's office directly with any questions.        IMPORTANT You must visit the Preparing for Your Procedure guide link below for additional instructions including fasting guidelines and directions before your procedure. If you received fasting or skin preparation instructions from your surgeon or pre-procedural provider, please follow those specific instructions. The instructions here are general instructions that do not pertain to all patients.  [email or web address]    If the link doesn't open, please copy and paste in your browser    QUESTIONS?  If you have any questions about your pre-procedural evaluation, please call the clinic location where your appointment was performed:     [place]: [phone]  [place]: [phone]  [place]: [phone]  [place]: [phone]  [place]: [phone]

## 2021-12-23 ENCOUNTER — Ambulatory Visit
Admission: RE | Admit: 2021-12-23 | Discharge: 2021-12-23 | Disposition: A | Discharge status: Home / Self Care | Payer: BC Managed Care – PPO | Source: Ambulatory Visit | Attending: Ophthalmology | Admitting: Ophthalmology

## 2021-12-23 ENCOUNTER — Encounter: Payer: Self-pay | Admitting: Ophthalmology

## 2021-12-23 ENCOUNTER — Ambulatory Visit: Payer: BC Managed Care – PPO | Admitting: Anesthesiology

## 2021-12-23 ENCOUNTER — Encounter
Admission: RE | Disposition: A | Discharge status: Home / Self Care | Payer: Self-pay | Source: Ambulatory Visit | Attending: Ophthalmology

## 2021-12-23 DIAGNOSIS — Z79899 Other long term (current) drug therapy: Secondary | ICD-10-CM | POA: Insufficient documentation

## 2021-12-23 DIAGNOSIS — H33012 Retinal detachment with single break, left eye: Secondary | ICD-10-CM | POA: Insufficient documentation

## 2021-12-23 DIAGNOSIS — H33022 Retinal detachment with multiple breaks, left eye: Secondary | ICD-10-CM | POA: Insufficient documentation

## 2021-12-23 HISTORY — PX: VITRECTOMY, ENDO LASER/GAS-FLUID EXCH: SHX5711

## 2021-12-23 HISTORY — PX: VITRECTOMY, 25 GAUGE, MECHANICAL: SHX5707

## 2021-12-23 SURGERY — VITRECTOMY 25 GAUGE, MECHANICAL, PARS PLANA
Anesthesia: Anesthesia MAC / Sedation | Site: Eye | Laterality: Left | Wound class: Clean

## 2021-12-23 MED ORDER — MEPERIDINE HCL 25 MG/ML IJ SOLN
25.0000 mg | Freq: Once | INTRAMUSCULAR | Status: DC
Start: 2021-12-23 — End: 2021-12-23

## 2021-12-23 MED ORDER — FENTANYL CITRATE (PF) 50 MCG/ML IJ SOLN (WRAP)
INTRAMUSCULAR | Status: AC
Start: 2021-12-23 — End: ?
  Filled 2021-12-23: qty 2

## 2021-12-23 MED ORDER — LACTATED RINGERS IV SOLN
INTRAVENOUS | Status: DC | PRN
Start: 2021-12-23 — End: 2021-12-23

## 2021-12-23 MED ORDER — OXYCODONE-ACETAMINOPHEN 5-325 MG PO TABS
1.0000 | ORAL_TABLET | Freq: Once | ORAL | Status: DC | PRN
Start: 2021-12-23 — End: 2021-12-23

## 2021-12-23 MED ORDER — PHENYLEPHRINE HCL 2.5 % OP SOLN
OPHTHALMIC | Status: AC
Start: 2021-12-23 — End: ?
  Filled 2021-12-23: qty 1

## 2021-12-23 MED ORDER — NA CHONDROIT SULF-NA HYALURON 20-15 MG/0.5ML IO SOSY
PREFILLED_SYRINGE | INTRAOCULAR | Status: AC
Start: 2021-12-23 — End: ?
  Filled 2021-12-23: qty 0.5

## 2021-12-23 MED ORDER — NEOMYCIN-POLYMYXIN-DEXAMETH 3.5-10000-0.1 OP OINT
TOPICAL_OINTMENT | OPHTHALMIC | Status: DC | PRN
Start: 2021-12-23 — End: 2021-12-23
  Administered 2021-12-23: 1 g via OPHTHALMIC

## 2021-12-23 MED ORDER — PHENYLEPHRINE HCL 2.5 % OP SOLN
1.0000 [drp] | OPHTHALMIC | Status: AC
Start: 2021-12-23 — End: 2021-12-23
  Administered 2021-12-23 (×2): 1 [drp] via OPHTHALMIC

## 2021-12-23 MED ORDER — DEXAMETHASONE SOD PHOSPHATE PF 10 MG/ML IJ SOLN
INTRAMUSCULAR | Status: AC
Start: 2021-12-23 — End: ?
  Filled 2021-12-23: qty 1

## 2021-12-23 MED ORDER — BUPIVACAINE HCL (PF) 0.75 % IJ SOLN
INTRAMUSCULAR | Status: AC
Start: 2021-12-23 — End: ?
  Filled 2021-12-23: qty 10

## 2021-12-23 MED ORDER — TETRACAINE HCL 0.5 % OP SOLN
OPHTHALMIC | Status: AC
Start: 2021-12-23 — End: ?
  Filled 2021-12-23: qty 4

## 2021-12-23 MED ORDER — HYPROMELLOSE PF 1.7 % OP SOSY
PREFILLED_SYRINGE | OPHTHALMIC | Status: DC | PRN
Start: 2021-12-23 — End: 2021-12-23
  Administered 2021-12-23: 1 mL

## 2021-12-23 MED ORDER — TROPICAMIDE 1 % OP SOLN
OPHTHALMIC | Status: AC
Start: 2021-12-23 — End: ?
  Filled 2021-12-23: qty 15

## 2021-12-23 MED ORDER — BALANCED SALT IO SOLN
INTRAOCULAR | Status: DC | PRN
Start: 2021-12-23 — End: 2021-12-23
  Administered 2021-12-23: 200 mL via INTRAOCULAR

## 2021-12-23 MED ORDER — PERFLUOROPROPANE INJECTION
Status: DC | PRN
Start: 2021-12-23 — End: 2021-12-23
  Administered 2021-12-23: 1 via INTRAVITREAL

## 2021-12-23 MED ORDER — FENTANYL CITRATE (PF) 50 MCG/ML IJ SOLN (WRAP)
INTRAMUSCULAR | Status: DC | PRN
Start: 2021-12-23 — End: 2021-12-23
  Administered 2021-12-23 (×2): 50 ug via INTRAVENOUS

## 2021-12-23 MED ORDER — HYDROMORPHONE HCL 0.5 MG/0.5 ML IJ SOLN
0.5000 mg | INTRAMUSCULAR | Status: DC | PRN
Start: 2021-12-23 — End: 2021-12-23

## 2021-12-23 MED ORDER — ATROPINE SULFATE 1 % OP SOLN
OPHTHALMIC | Status: DC | PRN
Start: 2021-12-23 — End: 2021-12-23
  Administered 2021-12-23: 1 [drp] via OPHTHALMIC

## 2021-12-23 MED ORDER — ONDANSETRON HCL 4 MG/2ML IJ SOLN
INTRAMUSCULAR | Status: AC
Start: 2021-12-23 — End: ?
  Filled 2021-12-23: qty 2

## 2021-12-23 MED ORDER — NEOMYCIN-POLYMYXIN-DEXAMETH 3.5-10000-0.1 OP OINT
TOPICAL_OINTMENT | OPHTHALMIC | Status: AC
Start: 2021-12-23 — End: ?
  Filled 2021-12-23: qty 3.5

## 2021-12-23 MED ORDER — FENTANYL CITRATE (PF) 50 MCG/ML IJ SOLN (WRAP)
25.0000 ug | INTRAMUSCULAR | Status: DC | PRN
Start: 2021-12-23 — End: 2021-12-23

## 2021-12-23 MED ORDER — LACTATED RINGERS IV SOLN
75.0000 mL/h | INTRAVENOUS | Status: DC
Start: 2021-12-23 — End: 2021-12-23

## 2021-12-23 MED ORDER — SODIUM CHLORIDE 0.9 % IV SOLN
INTRAVENOUS | Status: DC | PRN
Start: 2021-12-23 — End: 2021-12-23
  Administered 2021-12-23: 1 mL via SUBCONJUNCTIVAL

## 2021-12-23 MED ORDER — MIDAZOLAM HCL 1 MG/ML IJ SOLN (WRAP)
INTRAMUSCULAR | Status: AC
Start: 2021-12-23 — End: ?
  Filled 2021-12-23: qty 2

## 2021-12-23 MED ORDER — CYCLOPENTOLATE HCL 1 % OP SOLN
OPHTHALMIC | Status: AC
Start: 2021-12-23 — End: ?
  Filled 2021-12-23: qty 2

## 2021-12-23 MED ORDER — GLYCOPYRROLATE 0.2 MG/ML IJ SOLN (WRAP)
INTRAMUSCULAR | Status: DC | PRN
Start: 2021-12-23 — End: 2021-12-23
  Administered 2021-12-23 (×2): .1 mg via INTRAVENOUS

## 2021-12-23 MED ORDER — CYCLOPENTOLATE HCL 1 % OP SOLN
1.0000 [drp] | OPHTHALMIC | Status: AC
Start: 2021-12-23 — End: 2021-12-23
  Administered 2021-12-23 (×2): 1 [drp] via OPHTHALMIC

## 2021-12-23 MED ORDER — CEFAZOLIN SODIUM 1 G IJ SOLR
INTRAMUSCULAR | Status: AC
Start: 2021-12-23 — End: ?
  Filled 2021-12-23: qty 1000

## 2021-12-23 MED ORDER — METOCLOPRAMIDE HCL 5 MG/ML IJ SOLN
10.0000 mg | Freq: Once | INTRAMUSCULAR | Status: DC | PRN
Start: 2021-12-23 — End: 2021-12-23

## 2021-12-23 MED ORDER — TROPICAMIDE 1 % OP SOLN
1.0000 [drp] | OPHTHALMIC | Status: AC
Start: 2021-12-23 — End: 2021-12-23
  Administered 2021-12-23 (×2): 1 [drp] via OPHTHALMIC

## 2021-12-23 MED ORDER — LIDOCAINE HCL (PF) 2 % IJ SOLN
INTRAMUSCULAR | Status: DC | PRN
Start: 2021-12-23 — End: 2021-12-23
  Administered 2021-12-23: 5 mL

## 2021-12-23 MED ORDER — ONDANSETRON HCL 4 MG/2ML IJ SOLN
INTRAMUSCULAR | Status: DC | PRN
Start: 2021-12-23 — End: 2021-12-23
  Administered 2021-12-23: 4 mg via INTRAVENOUS

## 2021-12-23 MED ORDER — LIDOCAINE HCL (PF) 2 % IJ SOLN
INTRAMUSCULAR | Status: AC
Start: 2021-12-23 — End: ?
  Filled 2021-12-23: qty 5

## 2021-12-23 MED ORDER — BSS IO SOLN
INTRAOCULAR | Status: DC | PRN
Start: 2021-12-23 — End: 2021-12-23
  Administered 2021-12-23: 10 mL via TOPICAL

## 2021-12-23 MED ORDER — SODIUM CHLORIDE (PF) 0.9 % IJ SOLN
INTRAMUSCULAR | Status: AC
Start: 2021-12-23 — End: ?
  Filled 2021-12-23: qty 10

## 2021-12-23 MED ORDER — MIDAZOLAM HCL 1 MG/ML IJ SOLN (WRAP)
INTRAMUSCULAR | Status: DC | PRN
Start: 2021-12-23 — End: 2021-12-23
  Administered 2021-12-23: 2 mg via INTRAVENOUS

## 2021-12-23 MED ORDER — STERILE WATER FOR IRRIGATION IR SOLN
Status: DC | PRN
Start: 2021-12-23 — End: 2021-12-23
  Administered 2021-12-23: 100 mL

## 2021-12-23 MED ORDER — TETRACAINE HCL 0.5 % OP SOLN
1.0000 [drp] | Freq: Once | OPHTHALMIC | Status: AC
Start: 2021-12-23 — End: 2021-12-23
  Administered 2021-12-23: 1 [drp] via OPHTHALMIC

## 2021-12-23 MED ORDER — HYDROMORPHONE HCL 2 MG PO TABS
2.0000 mg | ORAL_TABLET | Freq: Once | ORAL | Status: DC | PRN
Start: 2021-12-23 — End: 2021-12-23

## 2021-12-23 MED ORDER — ONDANSETRON HCL 4 MG/2ML IJ SOLN
4.0000 mg | Freq: Once | INTRAMUSCULAR | Status: DC | PRN
Start: 2021-12-23 — End: 2021-12-23

## 2021-12-23 MED ORDER — HYPROMELLOSE PF 1.7 % OP SOSY
PREFILLED_SYRINGE | OPHTHALMIC | Status: AC
Start: 2021-12-23 — End: ?
  Filled 2021-12-23: qty 2

## 2021-12-23 SURGICAL SUPPLY — 48 items
CANNULA OPHTHALMIC SHORT SOFT TIP OD25 (Cannula) ×1
CANNULA OPHTHALMIC SHORT SOFT TIP OD25 GA ODSEC.8 MM GRIESHABER (Cannula) ×1 IMPLANT
CANNULA OPTH SHRT GRSHBR 25GA .8MM STRL (Cannula) ×1
CONSTELLATION AUTO GAS FILL PK (Opthamology Supply) ×1
DRAPE EQP SHWR CUP (Drape) ×1
DRAPE EQUIPMENT SHOWER CUP (Drape) ×1 IMPLANT
DRESSING TRANSPARENT L4 3/4 IN X W4 IN (Dressing) ×1
DRESSING TRANSPARENT L4 3/4 IN X W4 IN POLYURETHANE ADHESIVE (Dressing) ×1 IMPLANT
DRESSING TRNS PU STD TGDRM 4.75X4IN LF (Dressing) ×1
EYE SHIELD SOFT BLUE RUBBER (Personal Protection) ×1
GOWN SRG SMS PE LG SIRUS 43IN LF STRL (Gown) ×2
GOWN SURGICAL LARGE L43 IN SMS (Gown) ×2
GOWN SURGICAL LARGE L43 IN SMS POLYETHYLENE SIRUS BLUE LEVEL 4 (Gown) ×2 IMPLANT
LABEL MED LF STRL PK CSTM RTNA DISP (Other) ×1
LABEL MEDICAL PACK CUSTOM RETINA (Other) ×1
LABEL MEDICAL PACK CUSTOM RETINA CL22746 CUSTOM MED LABEL PACK (Other) ×1 IMPLANT
PACK VITRECTOMY 10000 CPM CONSTELLATION (Cannula) ×1
PACK VITRECTOMY 10000 CPM CONSTELLATION OD25+ GA (Cannula) ×1 IMPLANT
PACK VITRECTOMY CUSTOM 25G (Pack) ×2
PACK VITRECTOMY CUSTOM 25G AS1325816 (Pack) ×1 IMPLANT
PACK VITRECTOMY GAS FILL SYRINGE (Opthamology Supply) ×1
PACK VITRECTOMY GAS FILL SYRINGE CONSTELLATION ENGAUGE V-LOCITY (Opthamology Supply) IMPLANT
PACK VITRTM CNSTL 25+ GA LF STRL 10000 (Cannula) ×1
PROBE HEMOSTATIC ERASER 150 D TAPER OD25 GA BIPOLAR HOLLOW CORE TARGET (Cautery) ×1 IMPLANT
PROBE HMERSR 150D TPR WTFLD HMERSR 25GA (Cautery) ×2
PROBE LASER OD25 GA L2 IN ILLUMINATE (Laser Supplies) ×1
PROBE LASER OD25 GA L2 IN ILLUMINATE CURVE TIP LARGE TACTILE INDICATOR (Laser Supplies) ×1 IMPLANT
PROBE LSR NTNL PLS GLS 78D PUREPOINT 25 (Laser Supplies) ×1
RESTRAINT EXTREMITY L36 IN REGULAR SOFT (Patient Supply) ×1
RESTRAINT EXTREMITY L36 IN REGULAR SOFT QUILT CUFF QUICK RELEASE (Patient Supply) ×1 IMPLANT
RESTRAINT XTRMT REG PRCR 36IN SFT QUILT (Patient Supply) ×1
SHIELD OPHTHALMIC UNIVERSAL FLEXIBLE (Personal Protection) ×1
SHIELD OPHTHALMIC UNIVERSAL FLEXIBLE EDGE SOFGUARD THERMOPLASTIC (Personal Protection) ×1 IMPLANT
SOLUTION ANFG ISOPRPNL FM DVN DFGR FGOUT (Procedure Accessories) ×1
SOLUTION ANTIFOG NONTOXIC NONFLAMMABLE (Procedure Accessories) ×1
SOLUTION ANTIFOG NONTOXIC NONFLAMMABLE PAD DEVON ISOPROPANOL FOAM (Procedure Accessories) ×1 IMPLANT
SOLUTION IRR MLT ELCLY 500ML BSS GLS (Opthamology Supply) ×1
SOLUTION IRRIGATION MULTIPLE ELECTROLYTE (Opthamology Supply) ×1
SOLUTION IRRIGATION MULTIPLE ELECTROLYTE 500 ML GLASS CONTAINER BSS (Opthamology Supply) ×1 IMPLANT
SUTURE ABS 7-0 TG140-8 VCL MICROPOINT 18 (Suture) ×1
SUTURE COATED VICRYL 7-0 TG140-8 L18 IN (Suture) ×1
SUTURE COATED VICRYL 7-0 TG140-8 L18 IN 2 ARM BRD COATED VLT ABSRBBL (Suture) IMPLANT
SYRINGE MED 10ML RW REG BVL LL PRCSNGL (Syringes, Needles) ×4 IMPLANT
TAPE SRG PPR MCPR 1.5YDX1IN LF HPOAL (Tape) ×1
TAPE SURGICAL L1 1/2 YD X W1 IN (Tape) ×1
TAPE SURGICAL L1 1/2 YD X W1 IN HYPOALLERGENIC BREATHABLE MICROPORE (Tape) ×1 IMPLANT
WATER STERILE PLASTIC POUR BOTTLE 250 ML (Irrigation Solutions) ×1 IMPLANT
WATER STRL 250ML LF PLS PR BTL (Irrigation Solutions) ×1

## 2021-12-23 NOTE — Discharge Instructions (Signed)

## 2021-12-23 NOTE — Op Note (Signed)
FULL OPERATIVE NOTE     Date Time: 12/23/21 2:53 PM  Patient Name: Thomas Hawkins  Attending Physician: Binnie Rail, MD        Date of Operation:   12/23/2021     Providers Performing:   Surgeon: Binnie Rail, MD  Assistant: Lenna Gilford, MD     Operative Procedure:   Procedure(s):  VITRECTOMY, Retina Detachment Repair, Left Eye, 25 Gauge  VITRECTOMY, ENDO LASER/GAS-FLUID [facility]     Preoperative Diagnosis:   Rhegmatogenous retinal detachment with breaks, left eye     Postoperative Diagnosis:   Rhegmatogenous retinal detachment with breaks, left eye     Indications:   Loss of vision     Operative Notes:   Informed consent was obtained prior the procedure. The procedure, risks, benefits, and  alternatives were discussed with the patient. In the preoperative holding area the operative eye was marked. The patient was then brought back to the operating room and placed under monitored anesthesia care. The operative eye was then prepped and draped in the usual sterile fashion for ophthalmic surgery.     The microscope was brought into position. The patient  received a Sub-Tenon's block consisting of .75% Marcaine and 2% xylocaine through an inferonasal cutdown incision.  Standard 25 gauge instrumentation was used to create sclerotomies 4 mm posterior to the limbus. The infusion cannula was verified prior to turning on the infusion.  Upon visualization with the light pipe, the retinal detachment extended from 10 to 4 o'clock and there was one tear (11 o'clock) and two small holes (at 3 o'clock) within the detached retina.      A core vitrectomy was performed.  A posterior vitreous detachment was already present. Vitrectomy was carried out 360 degrees. The retina was inspected 360-degrees and no additional retinal tears or breaks were noted. Diathermy was applied to all breaks and used to create a retinotomy. An air fluid exchange was then performed draining fluid through the retinotomy.  Endolaser was applied to the breaks  after fluid-air exchange. The eye was then flushed with 45 cc of 22% SF6 gas to a moderate tactile tension.      The cannulas were pulled, the sclerotomies were sutured with 7-0 vicryl and the wounds were airtight.The pressure was moderate to tactile tension. A subconjunctival injection of ancef and dexamethasone was given. At the end of case the IOP < 15 mmHg, the nerve was perfused, and the retina was attached 360. The eye was dressed with Neomycin Polymyxin Dexamethasone ointment. A patch was placed over the eye. The patient tolerated the procedure well. There were no complications.  The patient received post operative instructions including follow up with [facility].            Estimated Blood Loss:   Less than 1 cc     Specimens:   None     Complications:   None        Dr. Cherre Blanc, surgical fellow, assisted with the entirety of the above case and assisted with portions of the surgery above that of a normal surgical assistant, including but not limited to scleral depression.

## 2021-12-23 NOTE — Op Note (Deleted)
FULL OPERATIVE NOTE    Date Time: 12/23/21 2:53 PM  Patient Name: Thomas Hawkins  Attending Physician: Binnie Rail, MD      Date of Operation:   12/23/2021    Providers Performing:   Surgeon: Binnie Rail, MD  Assistant: Lenna Gilford, MD    Operative Procedure:   Procedure(s):  VITRECTOMY, Retina Detachment Repair, Left Eye, 25 Gauge  VITRECTOMY, ENDO LASER/GAS-FLUID [facility]    Preoperative Diagnosis:   Rhegmatogenous retinal detachment with breaks, left eye    Postoperative Diagnosis:   Rhegmatogenous retinal detachment with breaks, left eye    Indications:   Loss of vision    Operative Notes:   Informed consent was obtained prior the procedure. The procedure, risks, benefits, and  alternatives were discussed with the patient. In the preoperative holding area the operative eye was marked. The patient was then brought back to the operating room and placed under monitored anesthesia care. The operative eye was then prepped and draped in the usual sterile fashion for ophthalmic surgery.    The microscope was brought into position. The patient  received a Sub-Tenon's block consisting of .75% Marcaine and 2% xylocaine through an inferonasal cutdown incision.  Standard 25 gauge instrumentation was used to create sclerotomies 4 mm posterior to the limbus. The infusion cannula was verified prior to turning on the infusion.  Upon visualization with the light pipe, the retinal detachment extended from 10 to 4 o'clock and there was one tear (11 o'clock) and two small holes (at 3 o'clock) within the detached retina.     A core vitrectomy was performed.  A posterior vitreous detachment was already present. Vitrectomy was carried out 360 degrees. The retina was inspected 360-degrees and no additional retinal tears or breaks were noted. Diathermy was applied to all breaks and used to create a retinotomy. An air fluid exchange was then performed draining fluid through the retinotomy.  Endolaser was applied to the breaks after  fluid-air exchange. The eye was then flushed with 45 cc of 22% SF6 gas to a moderate tactile tension.     The cannulas were pulled, the sclerotomies were sutured with 7-0 vicryl and the wounds were airtight.The pressure was moderate to tactile tension. A subconjunctival injection of ancef and dexamethasone was given. At the end of case the IOP < 15 mmHg, the nerve was perfused, and the retina was attached 360. The eye was dressed with Neomycin Polymyxin Dexamethasone ointment. A patch was placed over the eye. The patient tolerated the procedure well. There were no complications.  The patient received post operative instructions including follow up with [facility].          Estimated Blood Loss:   Less than 1 cc    Specimens:   None    Complications:   None      Dr. Cherre Blanc, surgical fellow, assisted with the entirety of the above case and assisted with portions of the surgery above that of a normal surgical assistant, including but not limited to scleral depression.       Signed by: Lenna Gilford, MD  12/23/21 2:53 PM

## 2021-12-23 NOTE — H&P (Signed)
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 male who presents to the hospital with retinal detachment of  left eye.    Past Medical History:     Past Medical History:   Diagnosis Date    Abnormal vision 2017    right eye retinal detachment    Bilateral cataracts     left eye - being watched    Disorder of musculoskeletal system     occassional muscle spasms low back, occassional neck pain from a whiplash injury    Diverticulitis 2015    one episode    Ear, nose and throat disorder     allergic rhinitis    Hypertension     under control with medication per pt    Sleep apnea     Hx in past-no longer requires CPAP due to weight loss       Past Surgical History:     Past Surgical History:   Procedure Laterality Date    COLONOSCOPY      RETINAL DETACHMENT SURGERY Right 2017    VITRECTOMY, 25 GAUGE, MECHANICAL Right 09/21/2017    Procedure: VITRECTOMY, 25 GAUGE, MECHANICAL;  Surgeon: Theda Belfast, MD;  Location: [place] MAIN OR;  Service: Ophthalmology;  Laterality: Right;    VITRECTOMY, ENDO LASER/GAS-FLUID [facility] Right 09/21/2017    Procedure: VITRECTOMY, ENDO LASER/GAS-FLUID EXCH;  Surgeon: Theda Belfast, MD;  Location: [place] MAIN OR;  Service: Ophthalmology;  Laterality: Right;       Allergies:   No Known Allergies    Medications:     Medications Prior to Admission   Medication Sig    cetirizine (ZYRTEC) 10 MG tablet Take 10 mg by mouth nightly.    Flaxseed, Linseed, (FLAXSEED OIL PO) Take 1 capsule by mouth daily    fluticasone (FLONASE) 50 MCG/ACT nasal spray 1 spray by Nasal route  every morning    lisinopril (PRINIVIL,ZESTRIL) 10 MG tablet Take 10 mg by mouth every morning    temazepam (RESTORIL) 15 MG capsule Take 15 mg by mouth nightly       Physical Exam:   There were no vitals filed for this visit.    left Eye: Visual Acuity 20/20     Retinal findings: [place] RRD OS    Neuro: Alert, Oriented, moves all extremities equally   Cardiac: Regular rate and rhythm, no murmur   Respiratory: Bilateral/equal breath sounds present     Labs:     Results       ** No results found for the last 24 hours. **            CONSENT    Operative Site Verified: Left  Informed consent Obtained: Specific benefits, risks, and alternatives discussed with patient and/or next of kin or guardian        Signed by: Lenna Gilford, MD

## 2021-12-23 NOTE — Anesthesia Postprocedure Evaluation (Signed)
Anesthesia Post Evaluation    Patient: Thomas Hawkins    Procedure(s):  VITRECTOMY, Retina Detachment Repair, Left Eye, 25 Gauge  VITRECTOMY, ENDO LASER/GAS-FLUID [facility]    Anesthesia type: MAC    Last Vitals:   Vitals Value Taken Time   BP 110/74 12/23/21 1500   Temp  12/23/21 1513   Pulse 43 12/23/21 1500   Resp 16 12/23/21 1500   SpO2 100 % 12/23/21 1500                 Anesthesia Post Evaluation:     Patient Evaluated: PACU  Patient Participation: complete - patient participated  Level of Consciousness: awake  Pain Score: 2  Pain Management: adequate    Airway Patency: patent    Anesthetic complications: No      PONV Status: none    Cardiovascular status: acceptable  Respiratory status: acceptable  Hydration status: acceptable        Signed by: Margo Aye, MD, 12/23/2021 3:13 PM

## 2021-12-23 NOTE — Transfer of Care (Signed)
Anesthesia Transfer of Care Note    Patient: Thomas Hawkins    Procedures performed: Procedure(s):  VITRECTOMY, Retina Detachment Repair, Left Eye, 25 Gauge  VITRECTOMY, ENDO LASER/GAS-FLUID [facility]    Anesthesia type: MAC    Patient location:Phase II PACU    Last vitals:   Vitals:    12/23/21 1311   BP:    Pulse: (!) 52   Resp: 16   Temp:    SpO2:        Post pain: Patient not complaining of pain, continue current therapy      Mental Status:awake    Respiratory Function: tolerating room air    Cardiovascular: stable    Nausea/Vomiting: patient not complaining of nausea or vomiting    Hydration Status: adequate    Post assessment: no apparent anesthetic complications    Signed by: Janan Halter, CRNA  12/23/21 2:47 PM

## 2021-12-23 NOTE — Anesthesia Preprocedure Evaluation (Addendum)
Anesthesia Evaluation    AIRWAY    Mallampati: II    TM distance: >3 FB  Neck ROM: full  Mouth Opening:full  Planned to use difficult airway equipment: No CARDIOVASCULAR    cardiovascular exam normal       DENTAL    no notable dental hx               PULMONARY    pulmonary exam normal     OTHER FINDINGS                                      Relevant Problems   No relevant active problems               Anesthesia Plan    ASA 2     MAC                     intravenous induction   Detailed anesthesia plan: general IV        Post op pain management: other    informed consent obtained              ===============================================================    ADDITIONAL MEDICAL HISTORY      No LMP for male patient.    There is no height or weight on file to calculate BMI.                     Scheduled  Procedure(s) with comments:  VITRECTOMY, Retina Detachment Repair, Left Eye, 25 Gauge - ASST=N  VITRECTOMY, ENDO LASER/GAS-FLUID [facility]  Attending Physician: Binnie Rail, MD  Admission Date:(Not on file)      Assessment:      Patient Active Problem List   Diagnosis   . Retinal detachment of left eye with single break          Past Medical History:   Diagnosis Date   . Abnormal vision 2017    right eye retinal detachment   . Bilateral cataracts     left eye - being watched   . Disorder of musculoskeletal system     occassional muscle spasms low back, occassional neck pain from a whiplash injury   . Diverticulitis 2015    one episode   . Ear, nose and throat disorder     allergic rhinitis   . Hypertension     under control with medication per pt   . Sleep apnea     Hx in past-no longer requires CPAP due to weight loss            Past Surgical History:   Procedure Laterality Date   . COLONOSCOPY     . RETINAL DETACHMENT SURGERY Right 2017   . VITRECTOMY, 25 GAUGE, MECHANICAL Right 09/21/2017    Procedure: VITRECTOMY, 25 GAUGE, MECHANICAL;  Surgeon: Theda Belfast, MD;  Location: [place] [facility] OR;  Service:  Ophthalmology;  Laterality: Right;   . VITRECTOMY, ENDO LASER/GAS-FLUID [facility] Right 09/21/2017    Procedure: VITRECTOMY, ENDO LASER/GAS-FLUID EXCH;  Surgeon: Theda Belfast, MD;  Location: [place] [facility] OR;  Service: Ophthalmology;  Laterality: Right;       Social History     Social History     Tobacco Use   . Smoking status: Former     Types: Cigarettes     Quit date: 09/20/1978     Years since quitting: 43.2   .  Smokeless tobacco: Never   Vaping Use   . Vaping status: Never Used   Substance Use Topics   . Alcohol use: Yes     Alcohol/week: 14.0 standard drinks of alcohol     Types: 14 Glasses of wine per week      Allergies:   No Known Allergies  Hospital Medications:     Home Medications:     No medications prior to admission.           Vitals:   There were no vitals filed for this visit.    Labs:     Results     ** No results found for the last 24 hours. **               No results found for: HGB, PLT    No results found for: NA, CL, CO2, K, CREAT, BUN, GLU    No results found for: AST, ALT, TROPI    No results found for: PT, PTT, INR    No results found for: COVID    Rads:     Radiology Results (24 Hour)     ** No results found for the last 24 hours. **              Risks discussed including but not limited to:    - Neurological complications such as stroke, TIA  - Cardiovascular complications such as heart attack, Arrhythmias, Cardiac arrest  - Pulmonary complications such as Asthmatic attack,Pulm. Aspiration, Bronchospasm and Pneumonia  - Intra-operative awareness,  - Dental Injuries  - Sore Throat.  - Allergic reactions.  - Death.     Anesthesia explained and Questions answered.     Pt understands and wishes to proceed.             Signed by: Margo Aye, MD 12/23/21 10:09 AM

## 2021-12-23 NOTE — Discharge Instr - AVS First Page (Addendum)
Post Operative Instructions:    Please bring this instruction sheet, the eye drops and the eye kit with you to the office.  Keep the patch overtop of the eye until you are examined tomorrow. Continue all other medications unless told otherwise.  Call the office if you have any questions or problems with the eye.    Follow up: Tomorrow in the office as previously arranged.  Symptoms of Concern:  If the following symptoms develop, please contact the office immediately: worsening of eye pain, severe headache, vomiting, or decreasing vision.  Medications: BEGIN AFTER FIRST DRESSING CHANGE TOMORROW; WASH HANDS BEFORE INSTILLING DROPS        [x]  Ofloxacin 4 x per day         [x]  Pred Forte 4 x per day                     4.  Please take Tylenol 500 mg 2 tablets; or Ibuprofen 200 mg 2 tablets every 4-6 hours as needed for pain.          5.   Positioning:         [x]  Upright during the day        [x]  Sleep on right side        [x]  Avoid airplane travel till cleared by your doctor         6. Arm Band: If you received a green arm band at the time of surgery, please do not remove it until instructed by your physician.     7.  Activities:  Avoid strenuous activities: heavy lifting, straining, sudden movement. You may watch TV or read. You may shower or shampoo beginning in one day after surgery. Avoid getting water directly in the eye. [] [facility]  [address]  [place], [place] [address]  [phone]  Fax: [phone]    [] [facility]  [address]  [place], [place] [address]  [phone]  Fax: [phone]    []  [place]  [address]  [facility] [address]  [place], [place] [address]        [] [place]  [address]  [place], [place] [address]  [phone]    [] [place]  [address]  [place], [place] [address]  [phone]    [] [place]  [address]  [place] [address]  [phone]      [] [place]  [address]  [place], [place]  [address]  [phone]    [] [facility]  [address] [place] [address]  [phone]    [] [place]  [address]  [place], [place] [address]  [phone]    []  [place]  [address] [place], [place] [address]  [phone]    [] [place]  [address]  [place], [place] [address]  [phone]    [] [facility]  [address]  [place], [place] [address]  [phone]    [] [facility]  [address]  [place], [place] [address]  [phone]

## 2021-12-24 ENCOUNTER — Encounter: Payer: Self-pay | Admitting: Ophthalmology

## 2022-01-12 ENCOUNTER — Other Ambulatory Visit: Payer: Self-pay | Admitting: Adult Health

## 2022-01-12 DIAGNOSIS — I4891 Unspecified atrial fibrillation: Secondary | ICD-10-CM

## 2022-02-01 ENCOUNTER — Encounter: Payer: Self-pay | Admitting: Emergency Medicine

## 2022-02-01 ENCOUNTER — Ambulatory Visit
Admission: EM | Admit: 2022-02-01 | Discharge: 2022-02-01 | Disposition: A | Discharge status: Home / Self Care | Payer: 59 | Attending: Internal Medicine | Admitting: Internal Medicine

## 2022-02-01 DIAGNOSIS — J069 Acute upper respiratory infection, unspecified: Secondary | ICD-10-CM | POA: Diagnosis not present

## 2022-02-01 DIAGNOSIS — R051 Acute cough: Secondary | ICD-10-CM

## 2022-02-01 MED ORDER — AMOXICILLIN 875 MG PO TABS: 875.0000 mg | ORAL_TABLET | Freq: Two times a day (BID) | ORAL | 0 refills | Status: AC

## 2022-02-01 NOTE — ED Provider Notes (Signed)
[facility]    CSN: [id number] Arrival date & time: 02/01/22  1729      History   Chief Complaint Chief Complaint  Patient presents with   Nasal drainage    HPI Jeff Gonzales is a 61 y.o. male.   Patient presents with nasal drainage, nasal congestion, itchy throat, cough that has been present for approximately 8 days.  Denies any known sick contacts or fever.  Denies chest pain, shortness of breath, ear pain, sore throat, nausea, vomiting, diarrhea, abdominal pain.  Patient has taken Motrin with minimal improvement.  Denies history of asthma or COPD.  Patient reports his symptoms are not improving and they are worsening.    Past Medical History:  Diagnosis Date   AF (atrial fibrillation) (HCC)    HA (headache)    Keratosis, actinic    head   Obstructive sleep apnea 2004   mild osa --AHI 7/hr.   Vertigo     Patient Active Problem List   Diagnosis Date Noted   Routine general medical examination at a health care facility 06/09/2016   Cardiac arrhythmia 03/18/2014   SLEEP APNEA, OBSTRUCTIVE 06/03/2010   ATRIAL FIBRILLATION 04/28/2007    Past Surgical History:  Procedure Laterality Date   NECK SURGERY     at 61 years old    WISDOM TOOTH EXTRACTION         Home Medications    Prior to Admission medications   Medication Sig Start Date End Date Taking? Authorizing Provider  amoxicillin (AMOXIL) 875 MG tablet Take 1 tablet (875 mg total) by mouth 2 (two) times daily for 7 days. 02/01/22 02/08/22 Yes Gustavus Bryant, FNP  aspirin EC 81 MG tablet Take 81 mg by mouth daily.   Yes [provider]  digoxin (LANOXIN) 0.25 MG tablet TAKE 1 TABLET BY MOUTH EVERY DAY 10/08/21  Yes Nafziger, Kandee Keen, NP  famotidine (PEPCID) 20 MG tablet Take 1 tablet (20 mg total) by mouth 2 (two) times daily. 07/30/20  Yes Elvina Sidle, MD  hydrocortisone (ANUSOL-HC) 2.5 % rectal cream Place 1 application rectally daily as needed for hemorrhoids or anal itching. 01/08/21  Yes  Nafziger, Kandee Keen, NP  LORazepam (ATIVAN) 0.5 MG tablet 1 tablet daily when necessary 01/08/21  Yes Nafziger, Kandee Keen, NP  metoprolol tartrate (LOPRESSOR) 25 MG tablet TAKE 1/2 TABLET (12.5MG ) TWICE DAILY AS NEEDED FOR PALPITATIONS. 01/08/21  Yes Nafziger, Kandee Keen, NP  quiniDINE gluconate 324 MG CR tablet TAKE 2 TABLETS IN THE MORNING, 2 TABLETS IN THE AFTERNOON, AND 2 TABLETS IN THE EVENING 01/12/22  Yes Nafziger, Kandee Keen, NP  sildenafil (REVATIO) 20 MG tablet Take 1 to 2 tabs 2 - 3 hours before sex 01/08/21  Yes Nafziger, Kandee Keen, NP    Family History Family History  Problem Relation Age of Onset   Melanoma Mother     Social History Social History   Tobacco Use   Smoking status: Former    Packs/day: 1.50    Types: Cigarettes    Quit date: 08/16/1983    Years since quitting: 38.4   Smokeless tobacco: Never  Substance Use Topics   Alcohol use: Yes    Alcohol/week: 2.0 standard drinks    Types: 2 Cans of beer per week   Drug use: No     Allergies   Patient has no known allergies.   Review of Systems Review of Systems Per HPI  Physical Exam Triage Vital Signs ED Triage Vitals  Enc Vitals Group     BP  02/01/22 1759 (!) 156/91     Pulse Rate 02/01/22 1759 73     Resp 02/01/22 1759 18     Temp 02/01/22 1759 97.9 F (36.6 C)     Temp Source 02/01/22 1759 Oral     SpO2 02/01/22 1759 96 %     Weight 02/01/22 1801 190 lb (86.2 kg)     Height 02/01/22 1801 5\' 11"  (1.803 m)     Head Circumference --      Peak Flow --      Pain Score 02/01/22 1801 0     Pain Loc --      Pain Edu? --      Excl. in GC? --    No data found.  Updated Vital Signs BP (!) 156/91 (BP Location: Right Arm)   Pulse 73   Temp 97.9 F (36.6 C) (Oral)   Resp 18   Ht 5\' 11"  (1.803 m)   Wt 190 lb (86.2 kg)   SpO2 96%   BMI 26.50 kg/m   Visual Acuity Right Eye Distance:   Left Eye Distance:   Bilateral Distance:    Right Eye Near:   Left Eye Near:    Bilateral Near:     Physical Exam Constitutional:       General: He is not in acute distress.    Appearance: Normal appearance. He is not toxic-appearing or diaphoretic.  HENT:     Head: Normocephalic and atraumatic.     Right Ear: Tympanic membrane and ear canal normal.     Left Ear: Tympanic membrane and ear canal normal.     Nose: Congestion present.     Mouth/Throat:     Mouth: Mucous membranes are moist.     Pharynx: No posterior oropharyngeal erythema.  Eyes:     Extraocular Movements: Extraocular movements intact.     Conjunctiva/sclera: Conjunctivae normal.     Pupils: Pupils are equal, round, and reactive to light.  Cardiovascular:     Rate and Rhythm: Normal rate and regular rhythm.     Pulses: Normal pulses.     Heart sounds: Normal heart sounds.  Pulmonary:     Effort: Pulmonary effort is normal. No respiratory distress.     Breath sounds: Normal breath sounds. No stridor. No wheezing, rhonchi or rales.  Abdominal:     General: Abdomen is flat. Bowel sounds are normal.     Palpations: Abdomen is soft.  Musculoskeletal:        General: Normal range of motion.     Cervical back: Normal range of motion.  Skin:    General: Skin is warm and dry.  Neurological:     General: No focal deficit present.     Mental Status: He is alert and oriented to person, place, and time. Mental status is at baseline.  Psychiatric:        Mood and Affect: Mood normal.        Behavior: Behavior normal.     UC Treatments / Results  Labs (all labs ordered are listed, but only abnormal results are displayed) Labs Reviewed - No data to display  EKG   Radiology No results found.  Procedures Procedures (including critical care time)  Medications Ordered in UC Medications - No data to display  Initial Impression / Assessment and Plan / UC Course  I have reviewed the triage vital signs and the nursing notes.  Pertinent labs & imaging results that were available during my care of the patient  were reviewed by me and considered in my  medical decision making (see chart for details).     Patient has upper respiratory infection.  Symptoms are worsening and not improving so I do think antibiotic therapy is warranted.  Will treat with amoxicillin given that patient takes digoxin and macrolides and tetracyclines are contraindicated.  Discussed supportive care and symptom management with patient as well.  Discussed return precautions.  Patient verbalized understanding and was agreeable with plan. Final Clinical Impressions(s) / UC Diagnoses   Final diagnoses:  Acute upper respiratory infection  Acute cough     Discharge Instructions      You are being treated with antibiotic for upper respiratory infection.  Please follow-up if symptoms persist or worsen.    ED Prescriptions     Medication Sig Dispense Auth. Provider   amoxicillin (AMOXIL) 875 MG tablet Take 1 tablet (875 mg total) by mouth 2 (two) times daily for 7 days. 14 tablet [place], Acie FredricksonHaley E, [place]NP      PDMP not reviewed this encounter.   Gustavus BryantMound, Issai Werling E, [place]FNP 02/01/22 [phone]

## 2022-02-01 NOTE — Discharge Instructions (Signed)
You are being treated with antibiotic for upper respiratory infection.  Please follow-up if symptoms persist or worsen.

## 2022-02-01 NOTE — ED Triage Notes (Signed)
Patient c/o nasal drainage, tickle in throat causing him to cough a lot x 4-5 days.  Patient has taken Motrin.

## 2022-06-17 ENCOUNTER — Telehealth: Payer: Self-pay | Admitting: Adult Health

## 2022-06-17 NOTE — Telephone Encounter (Signed)
Pt is calling and has been sch for 08-04-2022 11 am for cpe. Pt would like a sooner morning  appt NP is at max for cpe in morning is it ok to added another cpe for morning time

## 2022-06-21 NOTE — Telephone Encounter (Signed)
Lmom for pt to call back. 

## 2022-06-24 NOTE — Telephone Encounter (Signed)
Pt called back. Informed of NP's message.

## 2022-06-29 ENCOUNTER — Encounter: Payer: Self-pay | Admitting: Adult Health

## 2022-06-29 ENCOUNTER — Ambulatory Visit: Payer: 59 | Admitting: Adult Health

## 2022-06-29 VITALS — BP 130/80 | HR 75 | Temp 98.3°F | Ht 71.0 in | Wt 189.0 lb

## 2022-06-29 DIAGNOSIS — R3912 Poor urinary stream: Secondary | ICD-10-CM

## 2022-06-29 DIAGNOSIS — N521 Erectile dysfunction due to diseases classified elsewhere: Secondary | ICD-10-CM

## 2022-06-29 DIAGNOSIS — F419 Anxiety disorder, unspecified: Secondary | ICD-10-CM | POA: Diagnosis not present

## 2022-06-29 DIAGNOSIS — K649 Unspecified hemorrhoids: Secondary | ICD-10-CM | POA: Diagnosis not present

## 2022-06-29 DIAGNOSIS — N401 Enlarged prostate with lower urinary tract symptoms: Secondary | ICD-10-CM

## 2022-06-29 MED ORDER — HYDROCORTISONE (PERIANAL) 2.5 % EX CREA: 1.0000 | TOPICAL_CREAM | Freq: Every day | CUTANEOUS | 0 refills | Status: DC | PRN

## 2022-06-29 MED ORDER — SILDENAFIL CITRATE 20 MG PO TABS: ORAL_TABLET | ORAL | 1 refills | Status: DC

## 2022-06-29 MED ORDER — LORAZEPAM 0.5 MG PO TABS: ORAL_TABLET | ORAL | 0 refills | Status: DC

## 2022-06-29 NOTE — Progress Notes (Signed)
Subjective:    Patient ID: Raina Mina, male    DOB: 1960-11-21, 61 y.o.   MRN: [id number]  HPI 61 year old male who  has a past medical history of AF (atrial fibrillation) ([place]), HA (headache), Keratosis, actinic, Obstructive sleep apnea (2004), and Vertigo.  He presents to the office today for an acute on chronic issue.  He reports a longstanding history of mild BPH symptoms that possibly are getting progressively more apparent.  He reports waking up 1-4 times a night to urinate, has weak stream, dribbling, incomplete bladder emptying.  He has also noticed that "extreme bladder sensation" from time to time.  He denies pain with bowel movements, changes in urine color, odor, dysuria, or hematuria.  He also needs some medications refills    Review of Systems See HPI   Past Medical History:  Diagnosis Date   AF (atrial fibrillation) (HCC)    HA (headache)    Keratosis, actinic    head   Obstructive sleep apnea 2004   mild osa --AHI 7/hr.   Vertigo     Social History   Socioeconomic History   Marital status: Divorced    Spouse name: Not on file   Number of children: Not on file   Years of education: Not on file   Highest education level: Not on file  Occupational History   Not on file  Tobacco Use   Smoking status: Former    Packs/day: 1.50    Types: Cigarettes    Quit date: 08/16/1983    Years since quitting: 38.8   Smokeless tobacco: Never  Substance and Sexual Activity   Alcohol use: Yes    Alcohol/week: 2.0 standard drinks of alcohol    Types: 2 Cans of beer per week   Drug use: No   Sexual activity: Not on file  Other Topics Concern   Not on file  Social History Narrative   Not on file   Social Determinants of Health   Financial Resource Strain: Not on file  Food Insecurity: Not on file  Transportation Needs: Not on file  Physical Activity: Not on file  Stress: Not on file  Social Connections: Not on file  Intimate Partner Violence: Not on file     Past Surgical History:  Procedure Laterality Date   NECK SURGERY     at 61 years old    WISDOM TOOTH EXTRACTION      Family History  Problem Relation Age of Onset   Melanoma Mother     No Known Allergies  Current Outpatient Medications on File Prior to Visit  Medication Sig Dispense Refill   aspirin EC 81 MG tablet Take 81 mg by mouth daily.     digoxin (LANOXIN) 0.25 MG tablet TAKE 1 TABLET BY MOUTH EVERY DAY 90 tablet 4   famotidine (PEPCID) 20 MG tablet Take 1 tablet (20 mg total) by mouth 2 (two) times daily. 30 tablet 0   metoprolol tartrate (LOPRESSOR) 25 MG tablet TAKE 1/2 TABLET (12.5MG ) TWICE DAILY AS NEEDED FOR PALPITATIONS. 90 tablet 1   quiniDINE gluconate 324 MG CR tablet TAKE 2 TABLETS IN THE MORNING, 2 TABLETS IN THE AFTERNOON, AND 2 TABLETS IN THE EVENING 180 tablet 11   No current facility-administered medications on file prior to visit.    BP 130/80   Pulse 75   Temp 98.3 F (36.8 C) (Oral)   Ht 5\' 11"  (1.803 m)   Wt 189 lb (85.7 kg)   SpO2 98%  BMI 26.36 kg/m       Objective:   Physical Exam Vitals and nursing note reviewed.  Constitutional:      Appearance: Normal appearance.  Abdominal:     General: Abdomen is flat. Bowel sounds are normal. There is no distension.     Palpations: Abdomen is soft. There is no mass.  Genitourinary:    Prostate: Enlarged. Not tender and no nodules present.     Rectum: Normal.  Skin:    General: Skin is warm and dry.     Capillary Refill: Capillary refill takes less than 2 seconds.  Neurological:     General: No focal deficit present.     Mental Status: He is alert and oriented to person, place, and time.  Psychiatric:        Mood and Affect: Mood normal.        Behavior: Behavior normal.        Thought Content: Thought content normal.        Assessment & Plan:  1. Benign prostatic hyperplasia with weak urinary stream -[place] symptoms of worsening BPH.  Will check UA and PSA to rule out infection.   Consider Flomax if everything is normal - Urinalysis; Future - PSA; Future - CBC with Differential/Platelet; Future - CBC with Differential/Platelet - PSA - Urinalysis  2. Anxiety  - LORazepam (ATIVAN) 0.5 MG tablet; 1 tablet daily when necessary  Dispense: 30 tablet; Refill: 0  3. Erectile dysfunction due to diseases classified elsewhere  - sildenafil (REVATIO) 20 MG tablet; Take 1 to 2 tabs 2 - 3 hours before sex  Dispense: 20 tablet; Refill: 1  4. Hemorrhoids, unspecified hemorrhoid type  - hydrocortisone (ANUSOL-HC) 2.5 % rectal cream; Place 1 Application rectally daily as needed for hemorrhoids or anal itching.  Dispense: 30 g; Refill: 0  Dorothyann Peng, NP  Time spent with patient today was 31 minutes which consisted of chart review, discussing diagnosis, work up, treatment answering questions and documentation.

## 2022-06-30 ENCOUNTER — Telehealth: Payer: Self-pay | Admitting: Adult Health

## 2022-06-30 LAB — CBC WITH DIFFERENTIAL/PLATELET
Basophils Absolute: 0 10*3/uL (ref 0.0–0.1)
Basophils Relative: 1 % (ref 0.0–3.0)
Eosinophils Absolute: 0.1 10*3/uL (ref 0.0–0.7)
Eosinophils Relative: 1.4 % (ref 0.0–5.0)
HCT: 45.9 % (ref 39.0–52.0)
Hemoglobin: 15.2 g/dL (ref 13.0–17.0)
Lymphocytes Relative: 17.5 % (ref 12.0–46.0)
Lymphs Abs: 0.8 10*3/uL (ref 0.7–4.0)
MCHC: 33.1 g/dL (ref 30.0–36.0)
MCV: 88 fl (ref 78.0–100.0)
Monocytes Absolute: 0.3 10*3/uL (ref 0.1–1.0)
Monocytes Relative: 7.7 % (ref 3.0–12.0)
Neutro Abs: 3.3 10*3/uL (ref 1.4–7.7)
Neutrophils Relative %: 72.4 % (ref 43.0–77.0)
Platelets: 192 10*3/uL (ref 150.0–400.0)
RBC: 5.22 Mil/uL (ref 4.22–5.81)
RDW: 13.5 % (ref 11.5–15.5)
WBC: 4.5 10*3/uL (ref 4.0–10.5)

## 2022-06-30 LAB — PSA: PSA: 0.58 ng/mL (ref 0.10–4.00)

## 2022-06-30 NOTE — Addendum Note (Signed)
Addended by: Rosalyn Gess D on: 06/30/2022 10:08 AM   Modules accepted: Orders

## 2022-06-30 NOTE — Telephone Encounter (Signed)
Patient said he was returning a missed call

## 2022-07-01 ENCOUNTER — Telehealth: Payer: Self-pay | Admitting: Adult Health

## 2022-07-01 LAB — URINALYSIS
Bilirubin Urine: NEGATIVE
Hgb urine dipstick: NEGATIVE
Ketones, ur: NEGATIVE
Leukocytes,Ua: NEGATIVE
Nitrite: NEGATIVE
Specific Gravity, Urine: 1.025 (ref 1.000–1.030)
Urine Glucose: NEGATIVE
Urobilinogen, UA: 0.2 (ref 0.0–1.0)
pH: 8.5 — AB (ref 5.0–8.0)

## 2022-07-01 NOTE — Telephone Encounter (Signed)
Patient notified of update  and verbalized understanding. Pt will wait to see if it  is a UTI before starting the Flomax. Pt stated that his lower back is still hurting as well

## 2022-07-02 ENCOUNTER — Telehealth: Payer: 59 | Admitting: Physician Assistant

## 2022-07-02 ENCOUNTER — Telehealth: Payer: Self-pay | Admitting: Adult Health

## 2022-07-02 DIAGNOSIS — N3000 Acute cystitis without hematuria: Secondary | ICD-10-CM

## 2022-07-02 MED ORDER — CIPROFLOXACIN HCL 500 MG PO TABS: 500.0000 mg | ORAL_TABLET | Freq: Two times a day (BID) | ORAL | 0 refills | Status: AC

## 2022-07-02 NOTE — Progress Notes (Signed)
Virtual Visit Consent   Jeff Gonzales, you are scheduled for a virtual visit with a [facility] provider today. Just as with appointments in the office, your consent must be obtained to participate. Your consent will be active for this visit and any virtual visit you may have with one of our providers in the next 365 days. If you have a MyChart account, a copy of this consent can be sent to you electronically.  As this is a virtual visit, video technology does not allow for your provider to perform a traditional examination. This may limit your provider's ability to fully assess your condition. If your provider identifies any concerns that need to be evaluated in person or the need to arrange testing (such as labs, EKG, etc.), we will make arrangements to do so. Although advances in technology are sophisticated, we cannot ensure that it will always work on either your end or our end. If the connection with a video visit is poor, the visit may have to be switched to a telephone visit. With either a video or telephone visit, we are not always able to ensure that we have a secure connection.  By engaging in this virtual visit, you consent to the provision of healthcare and authorize for your insurance to be billed (if applicable) for the services provided during this visit. Depending on your insurance coverage, you may receive a charge related to this service.  I need to obtain your verbal consent now. Are you willing to proceed with your visit today? Jeff Gonzales has provided verbal consent on 07/02/2022 for a virtual visit (video or telephone). Mar Daring, PA-C  Date: 07/02/2022 11:59 AM  Virtual Visit via Video Note   I, Mar Daring, connected with  Jeff Gonzales  ([id number], May 06, 1961) on 07/02/22 at 11:00 AM EDT by a video-enabled telemedicine application and verified that I am speaking with the correct person using two identifiers.  Location: Patient: Virtual Visit Location  Patient: Home Provider: Virtual Visit Location Provider: Home Office   I discussed the limitations of evaluation and management by telemedicine and the availability of in person appointments. The patient expressed understanding and agreed to proceed.    History of Present Illness: Jeff Gonzales is a 61 y.o. who identifies as a male who was assigned male at birth, and is being seen today for urinalysis results. He was seen on Tuesday, 06/29/22, by his PCP for new onset "bladder awareness" and increased nocturia that is awakening him overnight. He had labs and UA. His provider also completed a DRE and reported no signs of prostatitis. Labs were unremarkable, no elevated WBC count or shift to indicate systemic infection. UA was over all unremarkable, but had trace protein and pH of 8.5 (normal 8.0). UA negative for leuks, nitrites. Per PCP note had mentioned him starting Flomax for possible BPH if no signs of infection.   He is continuing to have symptoms and feels that this is different than his BPH symptoms, does not feel it is that worsening. He is concerned it is truly an infection, even though testing is not showing this completely. He mentioned googling pH and that this could be a sign of infection and does not want to wait through the weekend without treatment and potential for any infection to worsen. He denies any fevers, chills, nausea or vomiting.    Problems:  Patient Active Problem List   Diagnosis Date Noted   Routine general medical examination at a health  care facility 06/09/2016   Cardiac arrhythmia 03/18/2014   SLEEP APNEA, OBSTRUCTIVE 06/03/2010   ATRIAL FIBRILLATION 04/28/2007    Allergies: No Known Allergies Medications:  Current Outpatient Medications:    ciprofloxacin (CIPRO) 500 MG tablet, Take 1 tablet (500 mg total) by mouth 2 (two) times daily for 3 days., Disp: 6 tablet, Rfl: 0   aspirin EC 81 MG tablet, Take 81 mg by mouth daily., Disp: , Rfl:    digoxin (LANOXIN)  0.25 MG tablet, TAKE 1 TABLET BY MOUTH EVERY DAY, Disp: 90 tablet, Rfl: 4   famotidine (PEPCID) 20 MG tablet, Take 1 tablet (20 mg total) by mouth 2 (two) times daily., Disp: 30 tablet, Rfl: 0   hydrocortisone (ANUSOL-HC) 2.5 % rectal cream, Place 1 Application rectally daily as needed for hemorrhoids or anal itching., Disp: 30 g, Rfl: 0   LORazepam (ATIVAN) 0.5 MG tablet, 1 tablet daily when necessary, Disp: 30 tablet, Rfl: 0   metoprolol tartrate (LOPRESSOR) 25 MG tablet, TAKE 1/2 TABLET (12.5MG ) TWICE DAILY AS NEEDED FOR PALPITATIONS., Disp: 90 tablet, Rfl: 1   quiniDINE gluconate 324 MG CR tablet, TAKE 2 TABLETS IN THE MORNING, 2 TABLETS IN THE AFTERNOON, AND 2 TABLETS IN THE EVENING, Disp: 180 tablet, Rfl: 11   sildenafil (REVATIO) 20 MG tablet, Take 1 to 2 tabs 2 - 3 hours before sex, Disp: 20 tablet, Rfl: 1  Observations/Objective: Patient is well-developed, well-nourished in no acute distress.  Resting comfortably at home.  Head is normocephalic, atraumatic.  No labored breathing.  Speech is clear and coherent with logical content.  Patient is alert and oriented at baseline.    Assessment and Plan: 1. Acute cystitis without hematuria - ciprofloxacin (CIPRO) 500 MG tablet; Take 1 tablet (500 mg total) by mouth 2 (two) times daily for 3 days.  Dispense: 6 tablet; Refill: 0  - Discussed UA does not appear to show signs of infection and labs were unremarkable. - Discussed PCP note and consideration of adding Flomax, but since this can take weeks to work he did not want to start that at this time - Discussed possible OAB and medication for that and potential side effects, again he politely declined - Advised I would give 3 day treatment of Cipro for possible infection despite testing not showing this just in case and to ease his concerns to get through the weekend - He is to follow up with his PCP on Monday if not improving or if not completely resolved  Follow Up Instructions: I  discussed the assessment and treatment plan with the patient. The patient was provided an opportunity to ask questions and all were answered. The patient agreed with the plan and demonstrated an understanding of the instructions.  A copy of instructions were sent to the patient via MyChart unless otherwise noted below.    The patient was advised to call back or seek an in-person evaluation if the symptoms worsen or if the condition fails to improve as anticipated.  Time:  I spent 15 minutes with the patient via telehealth technology discussing the above problems/concerns.    Mar Daring, PA-C

## 2022-07-02 NOTE — Patient Instructions (Signed)
Jeff Gonzales, thank you for joining Margaretann Loveless, PA-C for today's virtual visit.  While this provider is not your primary care provider (PCP), if your PCP is located in our provider database this encounter information will be shared with them immediately following your visit.   A [facility] MyChart account gives you access to today's visit and all your visits, tests, and labs performed at [facility] " click here if you don't have a [facility] MyChart account or go to [email or web address]  Consent: (Patient) Jeff Gonzales provided verbal consent for this virtual visit at the beginning of the encounter.  Current Medications:  Current Outpatient Medications:    ciprofloxacin (CIPRO) 500 MG tablet, Take 1 tablet (500 mg total) by mouth 2 (two) times daily for 3 days., Disp: 6 tablet, Rfl: 0   aspirin EC 81 MG tablet, Take 81 mg by mouth daily., Disp: , Rfl:    digoxin (LANOXIN) 0.25 MG tablet, TAKE 1 TABLET BY MOUTH EVERY DAY, Disp: 90 tablet, Rfl: 4   famotidine (PEPCID) 20 MG tablet, Take 1 tablet (20 mg total) by mouth 2 (two) times daily., Disp: 30 tablet, Rfl: 0   hydrocortisone (ANUSOL-HC) 2.5 % rectal cream, Place 1 Application rectally daily as needed for hemorrhoids or anal itching., Disp: 30 g, Rfl: 0   LORazepam (ATIVAN) 0.5 MG tablet, 1 tablet daily when necessary, Disp: 30 tablet, Rfl: 0   metoprolol tartrate (LOPRESSOR) 25 MG tablet, TAKE 1/2 TABLET (12.5MG ) TWICE DAILY AS NEEDED FOR PALPITATIONS., Disp: 90 tablet, Rfl: 1   quiniDINE gluconate 324 MG CR tablet, TAKE 2 TABLETS IN THE MORNING, 2 TABLETS IN THE AFTERNOON, AND 2 TABLETS IN THE EVENING, Disp: 180 tablet, Rfl: 11   sildenafil (REVATIO) 20 MG tablet, Take 1 to 2 tabs 2 - 3 hours before sex, Disp: 20 tablet, Rfl: 1   Medications ordered in this encounter:  Meds ordered this encounter  Medications   ciprofloxacin (CIPRO) 500 MG tablet    Sig: Take 1 tablet (500 mg total) by mouth 2 (two)  times daily for 3 days.    Dispense:  6 tablet    Refill:  0    Order Specific Question:   Supervising Provider    Answer:   Merrilee Jansky [id number]     *If you need refills on other medications prior to your next appointment, please contact your pharmacy*  Follow-Up: Call back or seek an in-person evaluation if the symptoms worsen or if the condition fails to improve as anticipated.  [facility] [phone]  Other Instructions Urinary Tract Infection, Adult  A urinary tract infection (UTI) is an infection of any part of the urinary tract. The urinary tract includes the kidneys, ureters, bladder, and urethra. These organs make, store, and get rid of urine in the body. An upper UTI affects the ureters and kidneys. A lower UTI affects the bladder and urethra. What are the causes? Most urinary tract infections are caused by bacteria in your genital area around your urethra, where urine leaves your body. These bacteria grow and cause inflammation of your urinary tract. What increases the risk? You are more likely to develop this condition if: You have a urinary catheter that stays in place. You are not able to control when you urinate or have a bowel movement (incontinence). You are male and you: Use a spermicide or diaphragm for birth control. Have low estrogen levels. Are pregnant. You have certain genes that increase your  risk. You are sexually active. You take antibiotic medicines. You have a condition that causes your flow of urine to slow down, such as: An enlarged prostate, if you are male. Blockage in your urethra. A kidney stone. A nerve condition that affects your bladder control (neurogenic bladder). Not getting enough to drink, or not urinating often. You have certain medical conditions, such as: Diabetes. A weak disease-fighting system (immunesystem). Sickle cell disease. Gout. Spinal cord injury. What are the signs or symptoms? Symptoms of  this condition include: Needing to urinate right away (urgency). Frequent urination. This may include small amounts of urine each time you urinate. Pain or burning with urination. Blood in the urine. Urine that smells bad or unusual. Trouble urinating. Cloudy urine. Vaginal discharge, if you are male. Pain in the abdomen or the lower back. You may also have: Vomiting or a decreased appetite. Confusion. Irritability or tiredness. A fever or chills. Diarrhea. The first symptom in older adults may be confusion. In some cases, they may not have any symptoms until the infection has worsened. How is this diagnosed? This condition is diagnosed based on your medical history and a physical exam. You may also have other tests, including: Urine tests. Blood tests. Tests for STIs (sexually transmitted infections). If you have had more than one UTI, a cystoscopy or imaging studies may be done to determine the cause of the infections. How is this treated? Treatment for this condition includes: Antibiotic medicine. Over-the-counter medicines to treat discomfort. Drinking enough water to stay hydrated. If you have frequent infections or have other conditions such as a kidney stone, you may need to see a health care provider who specializes in the urinary tract (urologist). In rare cases, urinary tract infections can cause sepsis. Sepsis is a life-threatening condition that occurs when the body responds to an infection. Sepsis is treated in the hospital with IV antibiotics, fluids, and other medicines. Follow these instructions at home:  Medicines Take over-the-counter and prescription medicines only as told by your health care provider. If you were prescribed an antibiotic medicine, take it as told by your health care provider. Do not stop using the antibiotic even if you start to feel better. General instructions Make sure you: Empty your bladder often and completely. Do not hold urine for  long periods of time. Empty your bladder after sex. Wipe from front to back after urinating or having a bowel movement if you are male. Use each tissue only one time when you wipe. Drink enough fluid to keep your urine pale yellow. Keep all follow-up visits. This is important. Contact a health care provider if: Your symptoms do not get better after 1-2 days. Your symptoms go away and then return. Get help right away if: You have severe pain in your back or your lower abdomen. You have a fever or chills. You have nausea or vomiting. Summary A urinary tract infection (UTI) is an infection of any part of the urinary tract, which includes the kidneys, ureters, bladder, and urethra. Most urinary tract infections are caused by bacteria in your genital area. Treatment for this condition often includes antibiotic medicines. If you were prescribed an antibiotic medicine, take it as told by your health care provider. Do not stop using the antibiotic even if you start to feel better. Keep all follow-up visits. This is important. This information is not intended to replace advice given to you by your health care provider. Make sure you discuss any questions you have with your health care  provider. Document Revised: 04/04/2020 Document Reviewed: 04/04/2020 Elsevier Patient Education  [place].    If you have been instructed to have an in-person evaluation today at a local Urgent Care facility, please use the link below. It will take you to a list of all of our available [facility], including address, phone number and hours of operation. Please do not delay care.  [facility]  If you or a family member do not have a primary care provider, use the link below to schedule a visit and establish care. When you choose a [facility] primary care physician or advanced practice provider, you gain a long-term partner in health. Find a Primary Care Provider  Learn more  about [facility]'s in-office and virtual care options: [facility]

## 2022-07-02 NOTE — Telephone Encounter (Signed)
Pt call and stated his lab results came on his mychart last night and it show some irregular reading and he want someone to call him back because he don't want to go all weekend and have a UTI and have no mediation .

## 2022-07-05 NOTE — Telephone Encounter (Signed)
Left a message for the patient to return my call.  

## 2022-07-05 NOTE — Telephone Encounter (Signed)
His urine was clear, so he does NOT have a UTI. He should take the Flomax as prescribed and follow up with [facility]

## 2022-07-06 NOTE — Telephone Encounter (Signed)
Patient informed of the results as below.  Patient questioned the meaning of the pH level?  Message sent to [facility] and the patient is aware he will be contacted tomorrow once reviewed.

## 2022-07-06 NOTE — Telephone Encounter (Signed)
Pt is returning Jeff Gonzales call °

## 2022-07-08 MED ORDER — TAMSULOSIN HCL 0.4 MG PO CAPS: 0.4000 mg | ORAL_CAPSULE | Freq: Every day | ORAL | 3 refills | Status: DC

## 2022-07-08 NOTE — Telephone Encounter (Signed)
Rx sent 

## 2022-07-08 NOTE — Telephone Encounter (Signed)
Patient informed of the message below and verbalized understanding. Patient stated Flomax was never sent and requested this be sent to pharmacy.

## 2022-08-04 ENCOUNTER — Encounter: Payer: Self-pay | Admitting: Adult Health

## 2022-08-04 ENCOUNTER — Ambulatory Visit (INDEPENDENT_AMBULATORY_CARE_PROVIDER_SITE_OTHER): Payer: 59 | Admitting: Adult Health

## 2022-08-04 VITALS — BP 130/80 | HR 67 | Temp 98.0°F | Ht 71.0 in | Wt 192.0 lb

## 2022-08-04 DIAGNOSIS — Z0000 Encounter for general adult medical examination without abnormal findings: Secondary | ICD-10-CM | POA: Diagnosis not present

## 2022-08-04 DIAGNOSIS — Z23 Encounter for immunization: Secondary | ICD-10-CM | POA: Diagnosis not present

## 2022-08-04 DIAGNOSIS — I4891 Unspecified atrial fibrillation: Secondary | ICD-10-CM

## 2022-08-04 DIAGNOSIS — R3914 Feeling of incomplete bladder emptying: Secondary | ICD-10-CM

## 2022-08-04 DIAGNOSIS — N401 Enlarged prostate with lower urinary tract symptoms: Secondary | ICD-10-CM

## 2022-08-04 DIAGNOSIS — N521 Erectile dysfunction due to diseases classified elsewhere: Secondary | ICD-10-CM

## 2022-08-04 DIAGNOSIS — Z1211 Encounter for screening for malignant neoplasm of colon: Secondary | ICD-10-CM

## 2022-08-04 DIAGNOSIS — F419 Anxiety disorder, unspecified: Secondary | ICD-10-CM

## 2022-08-04 LAB — COMPREHENSIVE METABOLIC PANEL
ALT: 24 U/L (ref 0–53)
AST: 20 U/L (ref 0–37)
Albumin: 4.7 g/dL (ref 3.5–5.2)
Alkaline Phosphatase: 59 U/L (ref 39–117)
BUN: 22 mg/dL (ref 6–23)
CO2: 31 mEq/L (ref 19–32)
Calcium: 9 mg/dL (ref 8.4–10.5)
Chloride: 102 mEq/L (ref 96–112)
Creatinine, Ser: 1.17 mg/dL (ref 0.40–1.50)
GFR: 66.39 mL/min (ref >60.00)
Glucose, Bld: 101 mg/dL — ABNORMAL HIGH (ref 70–99)
Potassium: 4.1 mEq/L (ref 3.5–5.1)
Sodium: 138 mEq/L (ref 135–145)
Total Bilirubin: 0.6 mg/dL (ref 0.2–1.2)
Total Protein: 7.1 g/dL (ref 6.0–8.3)

## 2022-08-04 LAB — LIPID PANEL
Cholesterol: 174 mg/dL (ref 0–200)
HDL: 50.5 mg/dL (ref >39.00)
LDL Cholesterol: 108 mg/dL — ABNORMAL HIGH (ref 0–99)
NonHDL: 123.57
Total CHOL/HDL Ratio: 3
Triglycerides: 77 mg/dL (ref 0.0–149.0)
VLDL: 15.4 mg/dL (ref 0.0–40.0)

## 2022-08-04 LAB — TSH: TSH: 2.81 u[IU]/mL (ref 0.35–5.50)

## 2022-08-04 MED ORDER — SILDENAFIL CITRATE 20 MG PO TABS: 20.0000 mg | ORAL_TABLET | ORAL | 6 refills | Status: DC | PRN

## 2022-08-04 MED ORDER — LORAZEPAM 0.5 MG PO TABS: 0.5000 mg | ORAL_TABLET | ORAL | 1 refills | Status: DC | PRN

## 2022-08-04 MED ORDER — METOPROLOL TARTRATE 25 MG PO TABS: ORAL_TABLET | ORAL | 0 refills | Status: DC

## 2022-08-04 NOTE — Progress Notes (Signed)
Subjective:    Patient ID: Jeff Gonzales, male    DOB: 1960-11-29, 61 y.o.   MRN: [id number]  HPI  Patient presents for yearly preventative medicine examination. He is a pleasant 61 year old male who  has a past medical history of AF (atrial fibrillation) (HCC), HA (headache), Keratosis, actinic, Obstructive sleep apnea (2004), and Vertigo.  Atrial fibrillation-diagnosed at the age of 38 and since that time he has been on digoxin 0.25 mg daily and quinidine 324 mg - 2 tablets 3 times daily.  He has not wanted to switch medications in the past and has refused to see cardiology.  He denies chest pain, shortness of breath, dyspnea, PND, lower extremity edema, or syncopal episodes.  He does have a prescription for metoprolol 12.5 mg twice daily as needed for palpitations.  ED -uses Viagra as needed  Anxiety-remittent and mild, uses Ativan 0.5 mg as needed.  BPH - he does have symptoms that include weak stream, dribbling, incomplete bladder emptying. Does not want to take medication currently.   All immunizations and health maintenance protocols were reviewed with the patient and needed orders were placed.  Appropriate screening laboratory values were ordered for the patient including screening of hyperlipidemia, renal function and hepatic function.  Medication reconciliation,  past medical history, social history, problem list and allergies were reviewed in detail with the patient  Goals were established with regard to weight loss, exercise, and  diet in compliance with medications. He swims two miles 3-4 times a week and does other activity throughout the week. He tries to heat health He is due for repeat colon cancer screening with cologuard.   Review of Systems  Constitutional: Negative.   HENT: Negative.    Eyes: Negative.   Respiratory: Negative.    Cardiovascular: Negative.   Gastrointestinal: Negative.   Endocrine: Negative.   Genitourinary:  Positive for decreased urine volume,  difficulty urinating, frequency and urgency.  Musculoskeletal: Negative.   Skin: Negative.   Allergic/Immunologic: Negative.   Neurological: Negative.   Hematological: Negative.   Psychiatric/Behavioral: Negative.    All other systems reviewed and are negative.  Past Medical History:  Diagnosis Date   AF (atrial fibrillation) (HCC)    HA (headache)    Keratosis, actinic    head   Obstructive sleep apnea 2004   mild osa --AHI 7/hr.   Vertigo     Social History   Socioeconomic History   Marital status: Divorced    Spouse name: Not on file   Number of children: Not on file   Years of education: Not on file   Highest education level: Not on file  Occupational History   Not on file  Tobacco Use   Smoking status: Former    Packs/day: 1.50    Types: Cigarettes    Quit date: 08/16/1983    Years since quitting: 38.9   Smokeless tobacco: Never  Substance and Sexual Activity   Alcohol use: Yes    Alcohol/week: 2.0 standard drinks of alcohol    Types: 2 Cans of beer per week   Drug use: No   Sexual activity: Not on file  Other Topics Concern   Not on file  Social History Narrative   Not on file   Social Determinants of Health   Financial Resource Strain: Not on file  Food Insecurity: Not on file  Transportation Needs: Not on file  Physical Activity: Not on file  Stress: Not on file  Social Connections: Not on file  Intimate Partner Violence: Not on file    Past Surgical History:  Procedure Laterality Date   NECK SURGERY     at 61 years old    WISDOM TOOTH EXTRACTION      Family History  Problem Relation Age of Onset   Melanoma Mother     No Known Allergies  Current Outpatient Medications on File Prior to Visit  Medication Sig Dispense Refill   aspirin EC 81 MG tablet Take 81 mg by mouth daily.     digoxin (LANOXIN) 0.25 MG tablet TAKE 1 TABLET BY MOUTH EVERY DAY 90 tablet 4   famotidine (PEPCID) 20 MG tablet Take 1 tablet (20 mg total) by mouth 2 (two)  times daily. 30 tablet 0   hydrocortisone (ANUSOL-HC) 2.5 % rectal cream Place 1 Application rectally daily as needed for hemorrhoids or anal itching. 30 g 0   LORazepam (ATIVAN) 0.5 MG tablet 1 tablet daily when necessary 30 tablet 0   metoprolol tartrate (LOPRESSOR) 25 MG tablet TAKE 1/2 TABLET (12.5MG ) TWICE DAILY AS NEEDED FOR PALPITATIONS. 90 tablet 1   quiniDINE gluconate 324 MG CR tablet TAKE 2 TABLETS IN THE MORNING, 2 TABLETS IN THE AFTERNOON, AND 2 TABLETS IN THE EVENING 180 tablet 11   sildenafil (REVATIO) 20 MG tablet Take 1 to 2 tabs 2 - 3 hours before sex 20 tablet 1   tamsulosin (FLOMAX) 0.4 MG CAPS capsule Take 1 capsule (0.4 mg total) by mouth daily. 90 capsule 3   No current facility-administered medications on file prior to visit.    There were no vitals taken for this visit.      Objective:   Physical Exam Vitals and nursing note reviewed.  Constitutional:      General: He is not in acute distress.    Appearance: Normal appearance. He is well-developed and normal weight.  HENT:     Head: Normocephalic and atraumatic.     Right Ear: Tympanic membrane, ear canal and external ear normal. There is no impacted cerumen.     Left Ear: Tympanic membrane, ear canal and external ear normal. There is no impacted cerumen.     Nose: Nose normal. No congestion or rhinorrhea.     Mouth/Throat:     Mouth: Mucous membranes are moist.     Pharynx: Oropharynx is clear. No oropharyngeal exudate or posterior oropharyngeal erythema.  Eyes:     General:        Right eye: No discharge.        Left eye: No discharge.     Extraocular Movements: Extraocular movements intact.     Conjunctiva/sclera: Conjunctivae normal.     Pupils: Pupils are equal, round, and reactive to light.  Neck:     Vascular: No carotid bruit.     Trachea: No tracheal deviation.  Cardiovascular:     Rate and Rhythm: Normal rate and regular rhythm.     Pulses: Normal pulses.     Heart sounds: Normal heart  sounds. No murmur heard.    No friction rub. No gallop.  Pulmonary:     Effort: Pulmonary effort is normal. No respiratory distress.     Breath sounds: Normal breath sounds. No stridor. No wheezing, rhonchi or rales.  Chest:     Chest wall: No tenderness.  Abdominal:     General: Bowel sounds are normal. There is no distension.     Palpations: Abdomen is soft. There is no mass.     Tenderness: There is no abdominal  tenderness. There is no right CVA tenderness, left CVA tenderness, guarding or rebound.     Hernia: No hernia is present.  Musculoskeletal:        General: No swelling, tenderness, deformity or signs of injury. Normal range of motion.     Right lower leg: No edema.     Left lower leg: No edema.  Lymphadenopathy:     Cervical: No cervical adenopathy.  Skin:    General: Skin is warm and dry.     Capillary Refill: Capillary refill takes less than 2 seconds.     Coloration: Skin is not jaundiced or pale.     Findings: No bruising, erythema, lesion or rash.  Neurological:     General: No focal deficit present.     Mental Status: He is alert and oriented to person, place, and time.     Cranial Nerves: No cranial nerve deficit.     Sensory: No sensory deficit.     Motor: No weakness.     Coordination: Coordination normal.     Gait: Gait normal.     Deep Tendon Reflexes: Reflexes normal.  Psychiatric:        Mood and Affect: Mood normal.        Behavior: Behavior normal.        Thought Content: Thought content normal.        Judgment: Judgment normal.       Assessment & Plan:  1. Routine general medical examination at a health care facility - Continue to eat healthy and exercise - Follow up in one year or sooner if needed  2. Atrial fibrillation, unspecified type (HCC) - Rate Controlled.  - Comprehensive metabolic panel; Future - Lipid panel; Future - TSH; Future - Digoxin level; Future - Quinidine level; Future - metoprolol tartrate (LOPRESSOR) 25 MG tablet;  TAKE 1/2 TABLET (12.5MG ) TWICE DAILY AS NEEDED FOR PALPITATIONS.  Dispense: 90 tablet; Refill: 0 - Quinidine level - Digoxin level - TSH - Lipid panel - Comprehensive metabolic panel  3. Benign prostatic hyperplasia with incomplete bladder emptying - Refuses medications. PSA recently checked  Lab Results  Component Value Date   PSA 0.58 06/29/2022   PSA 2.97 01/08/2021   PSA 0.79 12/19/2019    4. Erectile dysfunction due to diseases classified elsewhere  - sildenafil (REVATIO) 20 MG tablet; Take 1 tablet (20 mg total) by mouth as needed. Take 1 to 2 tabs 2 - 3 hours before sex  Dispense: 20 tablet; Refill: 6  5. Colon cancer screening  - Cologuard  6. Anxiety   (ATIVAN) 0.5 MG tablet; Take 1 tablet (0.5 mg total) by mouth as needed. Prn  Dispense: 30 tablet; Refill: 1  Shirline Frees, NP

## 2022-08-04 NOTE — Patient Instructions (Signed)
It was great seeing you today   We will follow up with you regarding your lab work   Please let me know if you need anything   

## 2022-08-06 LAB — QUINIDINE LEVEL: Quinidine: 2.6 mg/L (ref 2.0–5.0)

## 2022-08-06 LAB — DIGOXIN LEVEL: Digoxin Level: 1.5 mcg/L (ref 0.8–2.0)

## 2022-10-12 ENCOUNTER — Other Ambulatory Visit: Payer: Self-pay | Admitting: Adult Health

## 2022-10-12 DIAGNOSIS — I4891 Unspecified atrial fibrillation: Secondary | ICD-10-CM

## 2022-10-12 NOTE — Telephone Encounter (Signed)
Okay for refill?  

## 2023-01-22 ENCOUNTER — Other Ambulatory Visit: Payer: Self-pay | Admitting: Adult Health

## 2023-01-22 DIAGNOSIS — I4891 Unspecified atrial fibrillation: Secondary | ICD-10-CM

## 2023-01-25 NOTE — Telephone Encounter (Signed)
OK to fill. flagging

## 2023-02-17 ENCOUNTER — Telehealth: Payer: Self-pay | Admitting: Adult Health

## 2023-02-17 ENCOUNTER — Ambulatory Visit
Admission: RE | Admit: 2023-02-17 | Discharge: 2023-02-17 | Disposition: A | Discharge status: Home / Self Care | Payer: 59 | Source: Ambulatory Visit | Attending: Family Medicine | Admitting: Family Medicine

## 2023-02-17 ENCOUNTER — Ambulatory Visit
Admission: EM | Admit: 2023-02-17 | Discharge: 2023-02-17 | Disposition: A | Discharge status: Home / Self Care | Payer: 59 | Attending: Family Medicine | Admitting: Family Medicine

## 2023-02-17 DIAGNOSIS — I4891 Unspecified atrial fibrillation: Secondary | ICD-10-CM | POA: Diagnosis not present

## 2023-02-17 DIAGNOSIS — R072 Precordial pain: Secondary | ICD-10-CM | POA: Diagnosis not present

## 2023-02-17 NOTE — Telephone Encounter (Signed)
Cary with triage nurse line called and stated pt was recommended to go to the ED due to chest pain, however pt has refused. She states pt was having sharp pain in his chest last night that lasted several seconds. He also said he has been having brief episodes of chest pain on his upper left side several times in the last few months.

## 2023-02-17 NOTE — ED Provider Notes (Signed)
[facility]    CSN: [id number] Arrival date & time: 02/17/23  1338      History   Chief Complaint Chief Complaint  Patient presents with   Chest Pain    HPI Jeff Gonzales is a 62 y.o. male.    Chest Pain  Here for same chest pain that he experienced last evening.  He states it started when he stretched to relieve some discomfort in his back.  It lasted a few seconds.  He is not having any chest pain today.  This pain happened in his central lower sternal area.  He has a history of atrial fibrillation and his medicine list includes digoxin and quinidine.  He states he has been on this medication since the 1980s.  His primary care has been continuing to take care of his atrial fibrillation for him.  He has not seen a cardiologist in a long time.  He has also had a soreness in his left upper chest that seems to be worse right after he has been swimming.  No associated fever.  He does chronically have some burping but that is nothing new.    Past Medical History:  Diagnosis Date   AF (atrial fibrillation) (HCC)    HA (headache)    Keratosis, actinic    head   Obstructive sleep apnea 2004   mild osa --AHI 7/hr.   Vertigo     Patient Active Problem List   Diagnosis Date Noted   Routine general medical examination at a health care facility 06/09/2016   Cardiac arrhythmia 03/18/2014   SLEEP APNEA, OBSTRUCTIVE 06/03/2010   ATRIAL FIBRILLATION 04/28/2007    Past Surgical History:  Procedure Laterality Date   NECK SURGERY     at 62 years old    WISDOM TOOTH EXTRACTION         Home Medications    Prior to Admission medications   Medication Sig Start Date End Date Taking? Authorizing Provider  aspirin EC 81 MG tablet Take 81 mg by mouth daily.   Yes [provider]  digoxin (LANOXIN) 0.25 MG tablet TAKE 1 TABLET BY MOUTH EVERY DAY 10/12/22  Yes Nafziger, Kandee Keen, NP  famotidine (PEPCID) 20 MG tablet Take 1 tablet (20 mg total) by mouth 2 (two)  times daily. Patient taking differently: Take 20 mg by mouth as needed. 07/30/20  Yes Elvina Sidle, MD  LORazepam (ATIVAN) 0.5 MG tablet Take 1 tablet (0.5 mg total) by mouth as needed. Prn 08/04/22  Yes Nafziger, Kandee Keen, NP  quiniDINE gluconate 324 MG CR tablet TAKE 2 TABLETS IN THE MORNING, 2 TABLETS IN THE AFTERNOON, AND 2 TABLETS IN THE EVENING 01/26/23  Yes Nafziger, Kandee Keen, NP  tamsulosin (FLOMAX) 0.4 MG CAPS capsule Take 1 capsule (0.4 mg total) by mouth daily. Patient taking differently: Take 0.4 mg by mouth as needed. 07/08/22  Yes Nafziger, Kandee Keen, NP  hydrocortisone (ANUSOL-HC) 2.5 % rectal cream Place 1 Application rectally daily as needed for hemorrhoids or anal itching. 06/29/22   Nafziger, Kandee Keen, NP  sildenafil (REVATIO) 20 MG tablet Take 1 tablet (20 mg total) by mouth as needed. Take 1 to 2 tabs 2 - 3 hours before sex 08/04/22   Shirline Frees, NP    Family History Family History  Problem Relation Age of Onset   Melanoma Mother     Social History Social History   Tobacco Use   Smoking status: Former    Packs/day: 1.5    Types: Cigarettes  Quit date: 08/16/1983    Years since quitting: 39.5   Smokeless tobacco: Never  Substance Use Topics   Alcohol use: Yes    Alcohol/week: 2.0 standard drinks of alcohol    Types: 2 Cans of beer per week   Drug use: No     Allergies   Patient has no known allergies.   Review of Systems Review of Systems  Cardiovascular:  Positive for chest pain.     Physical Exam Triage Vital Signs ED Triage Vitals [02/17/23 1350]  Enc Vitals Group     BP (!) 151/90     Pulse Rate 79     Resp 16     Temp 98.2 F (36.8 C)     Temp Source Oral     SpO2 98 %     Weight      Height      Head Circumference      Peak Flow      Pain Score      Pain Loc      Pain Edu?      Excl. in GC?    No data found.  Updated Vital Signs BP (!) 151/90 (BP Location: Left Arm)   Pulse 79   Temp 98.3 F (36.8 C) (Oral)   Resp 16   SpO2 98%    Visual Acuity Right Eye Distance:   Left Eye Distance:   Bilateral Distance:    Right Eye Near:   Left Eye Near:    Bilateral Near:     Physical Exam Vitals reviewed.  Constitutional:      General: He is not in acute distress.    Appearance: He is not ill-appearing, toxic-appearing or diaphoretic.  HENT:     Mouth/Throat:     Mouth: Mucous membranes are moist.     Pharynx: No oropharyngeal exudate or posterior oropharyngeal erythema.  Eyes:     Extraocular Movements: Extraocular movements intact.     Conjunctiva/sclera: Conjunctivae normal.     Pupils: Pupils are equal, round, and reactive to light.  Cardiovascular:     Rate and Rhythm: Normal rate and regular rhythm.     Heart sounds: No murmur heard. Pulmonary:     Effort: No respiratory distress.     Breath sounds: Normal breath sounds. No stridor. No wheezing, rhonchi or rales.  Chest:     Chest wall: No tenderness.  Musculoskeletal:     Cervical back: Neck supple.  Lymphadenopathy:     Cervical: No cervical adenopathy.  Skin:    Capillary Refill: Capillary refill takes less than 2 seconds.     Coloration: Skin is not jaundiced or pale.  Neurological:     General: No focal deficit present.     Mental Status: He is alert and oriented to person, place, and time.  Psychiatric:        Behavior: Behavior normal.      UC Treatments / Results  Labs (all labs ordered are listed, but only abnormal results are displayed) Labs Reviewed - No data to display  EKG   Radiology No results found.  Procedures Procedures (including critical care time)  Medications Ordered in UC Medications - No data to display  Initial Impression / Assessment and Plan / UC Course  I have reviewed the triage vital signs and the nursing notes.  Pertinent labs & imaging results that were available during my care of the patient were reviewed by me and considered in my medical decision making (see chart for  details).         EKG is essentially unchanged from prior EKGs in our system.  I have asked him to proceed to the emergency room if he does have any more of this intense pain  That he had last night.  Since his chest is sore in other areas, chest x-ray is ordered.  It will be done as an outpatient as we do not have x-ray in the building today.  We will notify him if anything is significantly abnormal on the chest x-ray.  I have asked him to follow-up with his primary care and given him contact information for cardiology Final Clinical Impressions(s) / UC Diagnoses   Final diagnoses:  Precordial pain  Atrial fibrillation, unspecified type [facility])     Discharge Instructions      Your EKG is essentially unchanged from previous.  If you have any more of this severe chest pain, please consider going to emergency room at that time.  Please call your primary care about this issue.  We will notify you if there is anything significantly abnormal on your chest x-ray     ED Prescriptions   None    I have reviewed the PDMP during this encounter.   Zenia Resides, MD 02/17/23 1416

## 2023-02-17 NOTE — ED Triage Notes (Signed)
Pt is a current Education officer, community. Pt reports sore chest wall.

## 2023-02-17 NOTE — ED Triage Notes (Signed)
Here with mid central chest pain and pressure that started last night. Pt states he feels fine today,

## 2023-02-17 NOTE — Discharge Instructions (Signed)
Your EKG is essentially unchanged from previous.  If you have any more of this severe chest pain, please consider going to emergency room at that time.  Please call your primary care about this issue.  We will notify you if there is anything significantly abnormal on your chest x-ray

## 2023-02-17 NOTE — Telephone Encounter (Signed)
Spoke to pt and he stated that he is at the UC now and was advised to f/u. Pt will call to schedule an appt. To f/u with [facility].

## 2023-02-22 ENCOUNTER — Encounter: Payer: Self-pay | Admitting: Adult Health

## 2023-02-22 ENCOUNTER — Ambulatory Visit: Payer: 59 | Admitting: Adult Health

## 2023-02-22 ENCOUNTER — Encounter (HOSPITAL_COMMUNITY): Payer: Self-pay | Admitting: Family Medicine

## 2023-02-22 VITALS — BP 136/70 | HR 76 | Temp 98.7°F | Ht 71.0 in | Wt 192.0 lb

## 2023-02-22 DIAGNOSIS — K219 Gastro-esophageal reflux disease without esophagitis: Secondary | ICD-10-CM

## 2023-02-22 DIAGNOSIS — I4891 Unspecified atrial fibrillation: Secondary | ICD-10-CM

## 2023-02-22 MED ORDER — OMEPRAZOLE 20 MG PO CPDR
20.0000 mg | DELAYED_RELEASE_CAPSULE | Freq: Every day | ORAL | 0 refills | Status: DC
Start: 2023-02-22 — End: 2023-03-16

## 2023-02-22 NOTE — Progress Notes (Signed)
Subjective:    Patient ID: Jeff Gonzales, male    DOB: 10-29-1960, 62 y.o.   MRN: [id number]  HPI 62 year old male who  has a past medical history of AF (atrial fibrillation) (HCC), HA (headache), Keratosis, actinic, Obstructive sleep apnea (2004), and Vertigo.  He presents to the office today for follow-up for being seen at urgent care 5 days ago for chest pain that he experienced at evening.  He reports that the back pain started when he went to stretch to relieve some discomfort in his back.  Chest pain lasted a few seconds.  Chest pain was noted in the central lower sternal area.  He also had some soreness in his left upper chest that seem to be worse right after he had been swimming.  Have a history of atrial fibrillation when he takes digoxin and quinidine.  His EKG showed NSR with incomplete right bundle branch block rate 78  Today he reports that he continues to have intermittent pain in his lower sternum/epigastric area.  This pain is no longer as a intense on what took him to the emergency room.  Does have intermittent episodes of of a strong sour taste in the back of his throat when lying down.  No nausea vomiting, diarrhea.  He is open to going back and seeing cardiology due to his history of afib.     Review of Systems See HPI   Past Medical History:  Diagnosis Date   AF (atrial fibrillation) (HCC)    HA (headache)    Keratosis, actinic    head   Obstructive sleep apnea 2004   mild osa --AHI 7/hr.   Vertigo     Social History   Socioeconomic History   Marital status: Divorced    Spouse name: Not on file   Number of children: Not on file   Years of education: Not on file   Highest education level: Not on file  Occupational History   Not on file  Tobacco Use   Smoking status: Former    Packs/day: 1.5    Types: Cigarettes    Quit date: 08/16/1983    Years since quitting: 39.5   Smokeless tobacco: Never  Substance and Sexual Activity   Alcohol use: Yes     Alcohol/week: 2.0 standard drinks of alcohol    Types: 2 Cans of beer per week   Drug use: No   Sexual activity: Not on file  Other Topics Concern   Not on file  Social History Narrative   Not on file   Social Determinants of Health   Financial Resource Strain: Not on file  Food Insecurity: Not on file  Transportation Needs: Not on file  Physical Activity: Not on file  Stress: Not on file  Social Connections: Not on file  Intimate Partner Violence: Not on file    Past Surgical History:  Procedure Laterality Date   NECK SURGERY     at 62 years old    WISDOM TOOTH EXTRACTION      Family History  Problem Relation Age of Onset   Melanoma Mother     No Known Allergies  Current Outpatient Medications on File Prior to Visit  Medication Sig Dispense Refill   aspirin EC 81 MG tablet Take 81 mg by mouth daily.     digoxin (LANOXIN) 0.25 MG tablet TAKE 1 TABLET BY MOUTH EVERY DAY 90 tablet 3   famotidine (PEPCID) 20 MG tablet Take 1 tablet (20 mg total)  by mouth 2 (two) times daily. (Patient taking differently: Take 20 mg by mouth as needed.) 30 tablet 0   hydrocortisone (ANUSOL-HC) 2.5 % rectal cream Place 1 Application rectally daily as needed for hemorrhoids or anal itching. 30 g 0   LORazepam (ATIVAN) 0.5 MG tablet Take 1 tablet (0.5 mg total) by mouth as needed. Prn 30 tablet 1   quiniDINE gluconate 324 MG CR tablet TAKE 2 TABLETS IN THE MORNING, 2 TABLETS IN THE AFTERNOON, AND 2 TABLETS IN THE EVENING 180 tablet 11   sildenafil (REVATIO) 20 MG tablet Take 1 tablet (20 mg total) by mouth as needed. Take 1 to 2 tabs 2 - 3 hours before sex 20 tablet 6   tamsulosin (FLOMAX) 0.4 MG CAPS capsule Take 1 capsule (0.4 mg total) by mouth daily. (Patient taking differently: Take 0.4 mg by mouth as needed.) 90 capsule 3   No current facility-administered medications on file prior to visit.    BP 136/70   Pulse 76   Temp 98.7 F (37.1 C) (Oral)   Ht 5\' 11"  (1.803 m)   Wt 192 lb  (87.1 kg)   SpO2 98%   BMI 26.78 kg/m       Objective:   Physical Exam Vitals and nursing note reviewed.  Constitutional:      Appearance: Normal appearance.  Cardiovascular:     Rate and Rhythm: Normal rate and regular rhythm.     Pulses: Normal pulses.     Heart sounds: Normal heart sounds.  Pulmonary:     Effort: Pulmonary effort is normal.     Breath sounds: Normal breath sounds.  Abdominal:     General: Abdomen is flat. Bowel sounds are normal.     Palpations: Abdomen is soft.     Tenderness: There is abdominal tenderness in the epigastric area.  Skin:    General: Skin is warm and dry.  Neurological:     General: No focal deficit present.     Mental Status: He is alert.  Psychiatric:        Mood and Affect: Mood normal.        Behavior: Behavior normal.        Thought Content: Thought content normal.        Judgment: Judgment normal.        Assessment & Plan:  1. Atrial fibrillation, unspecified type [facility])  - Ambulatory referral to Cardiology  2. Gastroesophageal reflux disease without esophagitis -Will start him on Prilosec 20 mg daily x 2 to 3 weeks.  I am wondering if the pain he was experiencing has come from GERD/PUD. -Follow-up if he continues to have pain in the epigastric area after 2 to 3 weeks - omeprazole (PRILOSEC) 20 MG capsule; Take 1 capsule (20 mg total) by mouth daily.  Dispense: 30 capsule; Refill: 0  Shirline Frees, NP   Time spent with patient today was 31 minutes which consisted of chart review, discussing GERD  and afib, work up, treatment answering questions and documentation.

## 2023-03-16 ENCOUNTER — Other Ambulatory Visit: Payer: Self-pay | Admitting: Adult Health

## 2023-03-16 DIAGNOSIS — K219 Gastro-esophageal reflux disease without esophagitis: Secondary | ICD-10-CM

## 2023-04-07 ENCOUNTER — Ambulatory Visit: Payer: 59 | Admitting: Cardiovascular Disease

## 2023-05-02 ENCOUNTER — Encounter: Payer: Self-pay | Admitting: Adult Health

## 2023-05-31 ENCOUNTER — Encounter: Payer: Self-pay | Admitting: Adult Health

## 2023-06-01 ENCOUNTER — Ambulatory Visit: Payer: 59 | Admitting: Adult Health

## 2023-06-01 ENCOUNTER — Encounter: Payer: Self-pay | Admitting: Adult Health

## 2023-06-01 VITALS — BP 130/80 | HR 88 | Temp 97.9°F | Ht 71.0 in | Wt 193.0 lb

## 2023-06-01 DIAGNOSIS — L989 Disorder of the skin and subcutaneous tissue, unspecified: Secondary | ICD-10-CM | POA: Diagnosis not present

## 2023-06-01 NOTE — Progress Notes (Signed)
Subjective:    Patient ID: Jeff Gonzales, male    DOB: 25-Sep-1960, 62 y.o.   MRN: [id number]  HPI 62 year old male who  has a past medical history of AF (atrial fibrillation) (HCC), HA (headache), Keratosis, actinic, Obstructive sleep apnea (2004), and Vertigo.  He presents to the office today for an acute issue. He reports that over the last .... He has noticed a growth in the right corner of his mouth. This has been present for quite some time. Does not feel like this has grown in size. He would like it removed.   Review of Systems See HPI   Past Medical History:  Diagnosis Date   AF (atrial fibrillation) (HCC)    HA (headache)    Keratosis, actinic    head   Obstructive sleep apnea 2004   mild osa --AHI 7/hr.   Vertigo     Social History   Socioeconomic History   Marital status: Divorced    Spouse name: Not on file   Number of children: Not on file   Years of education: Not on file   Highest education level: Not on file  Occupational History   Not on file  Tobacco Use   Smoking status: Former    Current packs/day: 0.00    Types: Cigarettes    Quit date: 08/16/1983    Years since quitting: 39.8   Smokeless tobacco: Never  Substance and Sexual Activity   Alcohol use: Yes    Alcohol/week: 2.0 standard drinks of alcohol    Types: 2 Cans of beer per week   Drug use: No   Sexual activity: Not on file  Other Topics Concern   Not on file  Social History Narrative   Not on file   Social Determinants of Health   Financial Resource Strain: Not on file  Food Insecurity: Not on file  Transportation Needs: Not on file  Physical Activity: Not on file  Stress: Not on file  Social Connections: Not on file  Intimate Partner Violence: Not on file    Past Surgical History:  Procedure Laterality Date   NECK SURGERY     at 62 years old    WISDOM TOOTH EXTRACTION      Family History  Problem Relation Age of Onset   Melanoma Mother     No Known  Allergies  Current Outpatient Medications on File Prior to Visit  Medication Sig Dispense Refill   aspirin EC 81 MG tablet Take 81 mg by mouth daily.     digoxin (LANOXIN) 0.25 MG tablet TAKE 1 TABLET BY MOUTH EVERY DAY 90 tablet 3   famotidine (PEPCID) 20 MG tablet Take 1 tablet (20 mg total) by mouth 2 (two) times daily. (Patient taking differently: Take 20 mg by mouth as needed.) 30 tablet 0   hydrocortisone (ANUSOL-HC) 2.5 % rectal cream Place 1 Application rectally daily as needed for hemorrhoids or anal itching. 30 g 0   LORazepam (ATIVAN) 0.5 MG tablet Take 1 tablet (0.5 mg total) by mouth as needed. Prn 30 tablet 1   omeprazole (PRILOSEC) 20 MG capsule TAKE 1 CAPSULE BY MOUTH EVERY DAY 90 capsule 1   quiniDINE gluconate 324 MG CR tablet TAKE 2 TABLETS IN THE MORNING, 2 TABLETS IN THE AFTERNOON, AND 2 TABLETS IN THE EVENING 180 tablet 11   sildenafil (REVATIO) 20 MG tablet Take 1 tablet (20 mg total) by mouth as needed. Take 1 to 2 tabs 2 - 3 hours before  sex 20 tablet 6   tamsulosin (FLOMAX) 0.4 MG CAPS capsule Take 1 capsule (0.4 mg total) by mouth daily. (Patient taking differently: Take 0.4 mg by mouth as needed.) 90 capsule 3   No current facility-administered medications on file prior to visit.    BP 130/80   Pulse 88   Temp 97.9 F (36.6 C) (Oral)   Ht 5\' 11"  (1.803 m)   Wt 193 lb (87.5 kg)   SpO2 98%   BMI 26.92 kg/m       Objective:   Physical Exam Vitals and nursing note reviewed.  Constitutional:      Appearance: Normal appearance.  HENT:     Mouth/Throat:   Skin:    General: Skin is warm and dry.  Neurological:     General: No focal deficit present.     Mental Status: He is alert and oriented to person, place, and time.  Psychiatric:        Mood and Affect: Mood normal.        Behavior: Behavior normal.        Thought Content: Thought content normal.        Judgment: Judgment normal.       Assessment & Plan:  1. Benign skin growth - Due to  location of neoplasm I do not feel comfortable removing this in the office. Will refer to skin surgery center - Ambulatory referral to Dermatology  Shirline Frees, NP

## 2023-07-05 ENCOUNTER — Other Ambulatory Visit: Payer: Self-pay | Admitting: Adult Health

## 2023-07-05 DIAGNOSIS — F419 Anxiety disorder, unspecified: Secondary | ICD-10-CM

## 2023-07-06 NOTE — Telephone Encounter (Signed)
Ok to fill? Pt needs a CPE in Nov. However, unable to contact pt.

## 2023-07-08 ENCOUNTER — Encounter: Payer: Self-pay | Admitting: Adult Health

## 2023-07-08 ENCOUNTER — Ambulatory Visit: Payer: 59 | Admitting: Adult Health

## 2023-07-08 VITALS — BP 122/82 | HR 70 | Temp 98.0°F | Wt 193.0 lb

## 2023-07-08 DIAGNOSIS — K648 Other hemorrhoids: Secondary | ICD-10-CM | POA: Diagnosis not present

## 2023-07-08 MED ORDER — HYDROCORTISONE ACETATE 25 MG RE SUPP
25.0000 mg | Freq: Two times a day (BID) | RECTAL | 0 refills | Status: AC | PRN
Start: 2023-07-08 — End: ?

## 2023-07-08 NOTE — Progress Notes (Signed)
Subjective:    Patient ID: Jeff Gonzales, male    DOB: April 10, 1961, 62 y.o.   MRN: [id number]  HPI  62 year old male who  has a past medical history of AF (atrial fibrillation) (HCC), HA (headache), Keratosis, actinic, Obstructive sleep apnea (2004), and Vertigo.  He presents to the office today for an acute issue. He reports pain on the inside of his rectum that has been present for the last two weeks.   He has noticed a single drop of blood smeared on the toilet paper. The next day he felt like when he had a bowel movement it was " scraping along the side".  This has continued but seems to be better some days.   He has been using Vaseline around his anus in the hopes that it would " provide protection and hopefully have some lubrication"   His bowel movements have been hard, which is not unusual for him.   Review of Systems See HPI   Past Medical History:  Diagnosis Date   AF (atrial fibrillation) (HCC)    HA (headache)    Keratosis, actinic    head   Obstructive sleep apnea 2004   mild osa --AHI 7/hr.   Vertigo     Social History   Socioeconomic History   Marital status: Divorced    Spouse name: Not on file   Number of children: Not on file   Years of education: Not on file   Highest education level: Not on file  Occupational History   Not on file  Tobacco Use   Smoking status: Former    Current packs/day: 0.00    Types: Cigarettes    Quit date: 08/16/1983    Years since quitting: 39.9   Smokeless tobacco: Never  Substance and Sexual Activity   Alcohol use: Yes    Alcohol/week: 2.0 standard drinks of alcohol    Types: 2 Cans of beer per week   Drug use: No   Sexual activity: Not on file  Other Topics Concern   Not on file  Social History Narrative   Not on file   Social Determinants of Health   Financial Resource Strain: Not on file  Food Insecurity: Not on file  Transportation Needs: Not on file  Physical Activity: Not on file  Stress: Not on file   Social Connections: Not on file  Intimate Partner Violence: Not on file    Past Surgical History:  Procedure Laterality Date   NECK SURGERY     at 62 years old    WISDOM TOOTH EXTRACTION      Family History  Problem Relation Age of Onset   Melanoma Mother     No Known Allergies  Current Outpatient Medications on File Prior to Visit  Medication Sig Dispense Refill   aspirin EC 81 MG tablet Take 81 mg by mouth daily.     digoxin (LANOXIN) 0.25 MG tablet TAKE 1 TABLET BY MOUTH EVERY DAY 90 tablet 3   famotidine (PEPCID) 20 MG tablet Take 1 tablet (20 mg total) by mouth 2 (two) times daily. (Patient taking differently: Take 20 mg by mouth as needed.) 30 tablet 0   hydrocortisone (ANUSOL-HC) 2.5 % rectal cream Place 1 Application rectally daily as needed for hemorrhoids or anal itching. 30 g 0   LORazepam (ATIVAN) 0.5 MG tablet TAKE 1 TABLET (0.5 MG TOTAL) BY MOUTH AS NEEDED. 30 tablet 0   omeprazole (PRILOSEC) 20 MG capsule TAKE 1 CAPSULE BY MOUTH  EVERY DAY 90 capsule 1   quiniDINE gluconate 324 MG CR tablet TAKE 2 TABLETS IN THE MORNING, 2 TABLETS IN THE AFTERNOON, AND 2 TABLETS IN THE EVENING 180 tablet 11   sildenafil (REVATIO) 20 MG tablet Take 1 tablet (20 mg total) by mouth as needed. Take 1 to 2 tabs 2 - 3 hours before sex 20 tablet 6   tamsulosin (FLOMAX) 0.4 MG CAPS capsule Take 1 capsule (0.4 mg total) by mouth daily. (Patient taking differently: Take 0.4 mg by mouth as needed.) 90 capsule 3   No current facility-administered medications on file prior to visit.    BP 122/82   Pulse 70   Temp 98 F (36.7 C)   Wt 193 lb (87.5 kg)   SpO2 97%   BMI 26.92 kg/m       Objective:   Physical Exam Vitals reviewed.  Constitutional:      Appearance: Normal appearance.  Cardiovascular:     Rate and Rhythm: Normal rate and regular rhythm.     Pulses: Normal pulses.     Heart sounds: Normal heart sounds.  Pulmonary:     Effort: Pulmonary effort is normal.     Breath  sounds: Normal breath sounds.  Genitourinary:    Rectum: Guaiac result negative. Internal hemorrhoid (8 o'clock position) present.  Skin:    General: Skin is warm and dry.  Neurological:     General: No focal deficit present.     Mental Status: He is alert and oriented to person, place, and time.  Psychiatric:        Mood and Affect: Mood normal.        Behavior: Behavior normal.        Thought Content: Thought content normal.        Judgment: Judgment normal.       Assessment & Plan:   1. Internal hemorrhoids - No masses noted. No fissure present. Will treat for internal hemorrhoid  - Increase fiber with metamucil  - Follow up if not resolving in the next 2 weeks  - hydrocortisone (ANUSOL-HC) 25 MG suppository; Place 1 suppository (25 mg total) rectally 2 (two) times daily as needed for hemorrhoids.  Dispense: 24 suppository; Refill: 0   Shirline Frees, NP

## 2023-07-08 NOTE — Patient Instructions (Signed)
You have an internal hemorrhoid   I have sent in a suppository for you. Please use this twice a day as needed

## 2023-08-07 ENCOUNTER — Other Ambulatory Visit: Payer: Self-pay | Admitting: Adult Health

## 2023-08-07 DIAGNOSIS — F419 Anxiety disorder, unspecified: Secondary | ICD-10-CM

## 2023-08-09 NOTE — Telephone Encounter (Signed)
Okay for refill?   Last OV 07/08/2023

## 2023-10-19 ENCOUNTER — Ambulatory Visit (INDEPENDENT_AMBULATORY_CARE_PROVIDER_SITE_OTHER): Payer: 59 | Admitting: Adult Health

## 2023-10-19 VITALS — BP 138/100 | HR 72 | Temp 98.6°F | Ht 71.0 in | Wt 186.0 lb

## 2023-10-19 DIAGNOSIS — I4891 Unspecified atrial fibrillation: Secondary | ICD-10-CM

## 2023-10-19 DIAGNOSIS — N529 Male erectile dysfunction, unspecified: Secondary | ICD-10-CM

## 2023-10-19 DIAGNOSIS — F419 Anxiety disorder, unspecified: Secondary | ICD-10-CM

## 2023-10-19 DIAGNOSIS — Z0000 Encounter for general adult medical examination without abnormal findings: Secondary | ICD-10-CM

## 2023-10-19 DIAGNOSIS — Z1211 Encounter for screening for malignant neoplasm of colon: Secondary | ICD-10-CM

## 2023-10-19 DIAGNOSIS — Z23 Encounter for immunization: Secondary | ICD-10-CM | POA: Diagnosis not present

## 2023-10-19 DIAGNOSIS — R3914 Feeling of incomplete bladder emptying: Secondary | ICD-10-CM

## 2023-10-19 DIAGNOSIS — K649 Unspecified hemorrhoids: Secondary | ICD-10-CM

## 2023-10-19 DIAGNOSIS — K5900 Constipation, unspecified: Secondary | ICD-10-CM

## 2023-10-19 DIAGNOSIS — N521 Erectile dysfunction due to diseases classified elsewhere: Secondary | ICD-10-CM

## 2023-10-19 DIAGNOSIS — K648 Other hemorrhoids: Secondary | ICD-10-CM

## 2023-10-19 DIAGNOSIS — N401 Enlarged prostate with lower urinary tract symptoms: Secondary | ICD-10-CM | POA: Diagnosis not present

## 2023-10-19 LAB — COMPREHENSIVE METABOLIC PANEL
ALT: 24 U/L (ref 0–53)
AST: 24 U/L (ref 0–37)
Albumin: 4.7 g/dL (ref 3.5–5.2)
Alkaline Phosphatase: 67 U/L (ref 39–117)
BUN: 18 mg/dL (ref 6–23)
CO2: 30 meq/L (ref 19–32)
Calcium: 9.1 mg/dL (ref 8.4–10.5)
Chloride: 102 meq/L (ref 96–112)
Creatinine, Ser: 1.09 mg/dL (ref 0.40–1.50)
GFR: 71.67 mL/min (ref >60.00)
Glucose, Bld: 98 mg/dL (ref 70–99)
Potassium: 4 meq/L (ref 3.5–5.1)
Sodium: 138 meq/L (ref 135–145)
Total Bilirubin: 0.6 mg/dL (ref 0.2–1.2)
Total Protein: 6.9 g/dL (ref 6.0–8.3)

## 2023-10-19 LAB — CBC
HCT: 45.3 % (ref 39.0–52.0)
Hemoglobin: 14.9 g/dL (ref 13.0–17.0)
MCHC: 32.9 g/dL (ref 30.0–36.0)
MCV: 89.1 fL (ref 78.0–100.0)
Platelets: 210 10*3/uL (ref 150.0–400.0)
RBC: 5.09 Mil/uL (ref 4.22–5.81)
RDW: 13.1 % (ref 11.5–15.5)
WBC: 3.8 10*3/uL — ABNORMAL LOW (ref 4.0–10.5)

## 2023-10-19 LAB — TSH: TSH: 2.22 u[IU]/mL (ref 0.35–5.50)

## 2023-10-19 LAB — LIPID PANEL
Cholesterol: 138 mg/dL (ref 0–200)
HDL: 49.1 mg/dL (ref >39.00)
LDL Cholesterol: 74 mg/dL (ref 0–99)
NonHDL: 89
Total CHOL/HDL Ratio: 3
Triglycerides: 73 mg/dL (ref 0.0–149.0)
VLDL: 14.6 mg/dL (ref 0.0–40.0)

## 2023-10-19 LAB — PSA: PSA: 0.62 ng/mL (ref 0.10–4.00)

## 2023-10-19 MED ORDER — QUINIDINE GLUCONATE ER 324 MG PO TBCR: EXTENDED_RELEASE_TABLET | ORAL | 3 refills | Status: DC

## 2023-10-19 MED ORDER — SILDENAFIL CITRATE 20 MG PO TABS
20.0000 mg | ORAL_TABLET | ORAL | 6 refills | Status: AC | PRN
Start: 2023-10-19 — End: ?

## 2023-10-19 MED ORDER — LORAZEPAM 0.5 MG PO TABS
0.5000 mg | ORAL_TABLET | Freq: Four times a day (QID) | ORAL | 2 refills | Status: AC | PRN
Start: 2023-10-19 — End: 2023-11-18

## 2023-10-19 MED ORDER — TAMSULOSIN HCL 0.4 MG PO CAPS: 0.4000 mg | ORAL_CAPSULE | Freq: Every day | ORAL | 3 refills | Status: DC

## 2023-10-19 MED ORDER — HYDROCORTISONE (PERIANAL) 2.5 % EX CREA
1.0000 | TOPICAL_CREAM | Freq: Every day | CUTANEOUS | 0 refills | Status: AC | PRN
Start: 2023-10-19 — End: ?

## 2023-10-19 MED ORDER — DIGOXIN 250 MCG PO TABS: ORAL_TABLET | ORAL | 3 refills | Status: DC

## 2023-10-19 NOTE — Patient Instructions (Signed)
It was great seeing you today   We will follow up with you regarding your lab work   Please let me know if you need anything   Please follow up in 2 weeks to see how everything is going

## 2023-10-19 NOTE — Progress Notes (Signed)
Subjective:    Patient ID: Jeff Gonzales, male    DOB: 1961/07/02, 63 y.o.   MRN: [id number]  HPI  Patient presents for yearly preventative medicine examination. He is a 63 year old male who  has a past medical history of AF (atrial fibrillation) (HCC), HA (headache), Keratosis, actinic, Obstructive sleep apnea (2004), and Vertigo.  Atrial fibrillation-diagnosed at the age of 70 and since that time he has been on digoxin 0.25 mg daily and quinidine 324 mg - 2 tablets 3 times daily.  He has not wanted to switch medications in the past and has refused to see cardiology.  He denies chest pain, shortness of breath, dyspnea, PND, lower extremity edema, or syncopal episodes.  He does have a prescription for metoprolol 12.5 mg twice daily as needed for palpitations.  ED -uses Viagra as needed  Anxiety-remittent and mild, uses Ativan 0.5 mg as needed. As of lately he has had more anxiety. His daughter just got out of the hospital after having her gallbladder removed and she also had pancreatitis. He is also having to help out more with taking care of his elderly parents.   BPH - he does have symptoms that include weak stream, dribbling, incomplete bladder emptying. Does not want to take medication currently.   Hemorrhoids - he was seen about three months ago for pain on the inside of his rectum. At this time he also noticed small drops of blood on the toilet paper. He was constipated. On exam internal hemorrhoids were noted and he was given suppositories. Today he reports that the suppositories helped to some degree but his symptoms are still present intermittently. He is using fiber supplements twice a day and has had softer bowel movements for the most part but will still have " pebbles". He has also been " lubricating myself and squirting water up there to help."  All immunizations and health maintenance protocols were reviewed with the patient and needed orders were placed.  Appropriate screening  laboratory values were ordered for the patient including screening of hyperlipidemia, renal function and hepatic function. If indicated by BPH, a PSA was ordered.  Medication reconciliation,  past medical history, social history, problem list and allergies were reviewed in detail with the patient  Goals were established with regard to weight loss, exercise, and  diet in compliance with medications  He is due for repeat cologuard testing   Review of Systems  Constitutional: Negative.   HENT: Negative.    Eyes: Negative.   Respiratory: Negative.    Cardiovascular: Negative.   Gastrointestinal:  Positive for anal bleeding, constipation and rectal pain.  Endocrine: Negative.   Genitourinary: Negative.   Musculoskeletal: Negative.   Skin: Negative.   Allergic/Immunologic: Negative.   Neurological: Negative.   Hematological: Negative.   Psychiatric/Behavioral:  The patient is nervous/anxious.   All other systems reviewed and are negative.  Past Medical History:  Diagnosis Date   AF (atrial fibrillation) (HCC)    HA (headache)    Keratosis, actinic    head   Obstructive sleep apnea 2004   mild osa --AHI 7/hr.   Vertigo     Social History   Socioeconomic History   Marital status: Divorced    Spouse name: Not on file   Number of children: Not on file   Years of education: Not on file   Highest education level: Not on file  Occupational History   Not on file  Tobacco Use   Smoking status: Former  Current packs/day: 0.00    Types: Cigarettes    Quit date: 08/16/1983    Years since quitting: 40.2   Smokeless tobacco: Never  Substance and Sexual Activity   Alcohol use: Yes    Alcohol/week: 2.0 standard drinks of alcohol    Types: 2 Cans of beer per week   Drug use: No   Sexual activity: Not on file  Other Topics Concern   Not on file  Social History Narrative   Not on file   Social Drivers of Health   Financial Resource Strain: Not on file  Food Insecurity:  Not on file  Transportation Needs: Not on file  Physical Activity: Not on file  Stress: Not on file  Social Connections: Not on file  Intimate Partner Violence: Not on file    Past Surgical History:  Procedure Laterality Date   NECK SURGERY     at 63 years old    WISDOM TOOTH EXTRACTION      Family History  Problem Relation Age of Onset   Melanoma Mother     No Known Allergies  Current Outpatient Medications on File Prior to Visit  Medication Sig Dispense Refill   aspirin EC 81 MG tablet Take 81 mg by mouth daily.     famotidine (PEPCID) 20 MG tablet Take 1 tablet (20 mg total) by mouth 2 (two) times daily. (Patient taking differently: Take 20 mg by mouth as needed.) 30 tablet 0   hydrocortisone (ANUSOL-HC) 25 MG suppository Place 1 suppository (25 mg total) rectally 2 (two) times daily as needed for hemorrhoids. 24 suppository 0   omeprazole (PRILOSEC) 20 MG capsule TAKE 1 CAPSULE BY MOUTH EVERY DAY 90 capsule 1   No current facility-administered medications on file prior to visit.    BP (!) 138/100   Pulse 72   Temp 98.6 F (37 C) (Oral)   Ht 5\' 11"  (1.803 m)   Wt 186 lb (84.4 kg)   SpO2 99%   BMI 25.94 kg/m       Objective:   Physical Exam Vitals and nursing note reviewed.  Constitutional:      General: He is not in acute distress.    Appearance: Normal appearance. He is not ill-appearing.  HENT:     Head: Normocephalic and atraumatic.     Right Ear: Tympanic membrane, ear canal and external ear normal. There is no impacted cerumen.     Left Ear: Tympanic membrane, ear canal and external ear normal. There is no impacted cerumen.     Nose: Nose normal. No congestion or rhinorrhea.     Mouth/Throat:     Mouth: Mucous membranes are moist.     Pharynx: Oropharynx is clear.  Eyes:     Extraocular Movements: Extraocular movements intact.     Conjunctiva/sclera: Conjunctivae normal.     Pupils: Pupils are equal, round, and reactive to light.  Neck:      Vascular: No carotid bruit.  Cardiovascular:     Rate and Rhythm: Normal rate and regular rhythm.     Pulses: Normal pulses.     Heart sounds: No murmur heard.    No friction rub. No gallop.  Pulmonary:     Effort: Pulmonary effort is normal.     Breath sounds: Normal breath sounds.  Abdominal:     General: Abdomen is flat. Bowel sounds are normal. There is no distension.     Palpations: Abdomen is soft. There is no mass.     Tenderness: There  is no abdominal tenderness. There is no guarding or rebound.     Hernia: No hernia is present.  Musculoskeletal:        General: Normal range of motion.     Cervical back: Normal range of motion and neck supple.  Lymphadenopathy:     Cervical: No cervical adenopathy.  Skin:    General: Skin is warm and dry.     Capillary Refill: Capillary refill takes less than 2 seconds.  Neurological:     General: No focal deficit present.     Mental Status: He is alert and oriented to person, place, and time.  Psychiatric:        Mood and Affect: Mood is anxious.        Speech: Speech is rapid and pressured.        Behavior: Behavior normal.        Thought Content: Thought content normal.        Cognition and Memory: Cognition and memory normal.        Judgment: Judgment normal.           Assessment & Plan:  1. Routine general medical examination at a health care facility (Primary) Today patient counseled on age appropriate routine health concerns for screening and prevention, each reviewed and up to date or declined. Immunizations reviewed and up to date or declined. Labs ordered and reviewed. Risk factors for depression reviewed and negative. Hearing function and visual acuity are intact. ADLs screened and addressed as needed. Functional ability and level of safety reviewed and appropriate. Education, counseling and referrals performed based on assessed risks today. Patient provided with a copy of personalized plan for preventive services.   2.  Atrial fibrillation, unspecified type (HCC) - SR today.  - Lipid panel; Future - TSH; Future - CBC; Future - Comprehensive metabolic panel; Future - Digoxin level; Future - Quinidine level; Future - digoxin (LANOXIN) 0.25 MG tablet; TAKE 1 TABLET BY MOUTH EVERY DAY  Dispense: 90 tablet; Refill: 3 - quiniDINE gluconate 324 MG CR tablet; TAKE 2 TABLETS IN THE MORNING, 2 TABLETS IN THE AFTERNOON, AND 2 TABLETS IN THE EVENING  Dispense: 180 tablet; Refill: 3 - Quinidine level - Digoxin level - Comprehensive metabolic panel - CBC - TSH - Lipid panel  3. Erectile dysfunction, unspecified erectile dysfunction type - Continue with Sildenafil.   4. Anxiety  - LORazepam (ATIVAN) 0.5 MG tablet; Take 1 tablet (0.5 mg total) by mouth every 6 (six) hours as needed for anxiety.  Dispense: 45 tablet; Refill: 2  5. Benign prostatic hyperplasia with incomplete bladder emptying  - PSA; Future - tamsulosin (FLOMAX) 0.4 MG CAPS capsule; Take 1 capsule (0.4 mg total) by mouth daily.  Dispense: 90 capsule; Refill: 3 - PSA  6. Colon cancer screening - Refuses colonoscopy  - Cologuard  7. Constipation, unspecified constipation type - Increase fiber   8. Internal hemorrhoids - Advised to stop doing water enemas. Can try increasing fiber. Discussed GI referral but he refused at this time  - Follow up in 2 weeks   9. Erectile dysfunction due to diseases classified elsewhere  - sildenafil (REVATIO) 20 MG tablet; Take 1 tablet (20 mg total) by mouth as needed. Take 1 to 2 tabs 2 - 3 hours before sex  Dispense: 20 tablet; Refill: 6  10. Hemorrhoids, unspecified hemorrhoid type  - hydrocortisone (ANUSOL-HC) 2.5 % rectal cream; Place 1 Application rectally daily as needed for hemorrhoids or anal itching.  Dispense: 30 g; Refill:  0  11. Need for tetanus booster  - Tdap vaccine greater than or equal to 7yo IM  Shirline Frees, NP

## 2023-10-20 ENCOUNTER — Encounter: Payer: Self-pay | Admitting: Adult Health

## 2023-10-22 LAB — QUINIDINE LEVEL: Quinidine: 2 mg/L (ref 2.0–5.0)

## 2023-10-22 LAB — DIGOXIN LEVEL: Digoxin Level: 1 ug/L (ref 0.8–2.0)

## 2023-10-26 ENCOUNTER — Ambulatory Visit: Payer: 59 | Admitting: Adult Health

## 2023-10-28 ENCOUNTER — Encounter: Payer: Self-pay | Admitting: Adult Health

## 2023-10-28 ENCOUNTER — Ambulatory Visit: Payer: 59 | Admitting: Adult Health

## 2023-10-28 VITALS — BP 138/80 | HR 72 | Temp 98.0°F | Ht 71.0 in | Wt 186.0 lb

## 2023-10-28 DIAGNOSIS — Z23 Encounter for immunization: Secondary | ICD-10-CM

## 2023-10-28 DIAGNOSIS — R361 Hematospermia: Secondary | ICD-10-CM | POA: Diagnosis not present

## 2023-10-28 LAB — POC URINALSYSI DIPSTICK (AUTOMATED)
Bilirubin, UA: NEGATIVE
Blood, UA: NEGATIVE
Glucose, UA: NEGATIVE
Ketones, UA: NEGATIVE
Leukocytes, UA: NEGATIVE
Nitrite, UA: NEGATIVE
Protein, UA: NEGATIVE
Spec Grav, UA: 1.01 (ref 1.010–1.025)
Urobilinogen, UA: 0.2 U/dL
pH, UA: 6.5 (ref 5.0–8.0)

## 2023-10-28 NOTE — Progress Notes (Addendum)
 Subjective:    Patient ID: Jeff Gonzales, male    DOB: 28-Jan-1961, 63 y.o.   MRN: [id number]  HPI 63 year old male who  has a past medical history of AF (atrial fibrillation) (HCC), HA (headache), Keratosis, actinic, Obstructive sleep apnea (2004), and Vertigo.   Discussed the use of AI scribe software for clinical note transcription with the patient, who gave verbal consent to proceed.  History of Present Illness   The patient, with a history of benign prostatic hyperplasia, presents with recurrent hematospermia. He reports this has occurred four or five times over the past twenty to twenty-five years, but recently has been more frequent. The patient describes the initial episode as fresh blood in the semen, followed by a brown, grainy discharge a few days later, which he attributes to dried blood. This pattern has repeated three times in the past two weeks. He denies any associated pain, discoloration, or irritation. The patient also reports a significant decrease in semen production, which he attributes to his enlarged prostate. He denies any new lumps or bumps in the scrotum or testicles, but reports occasional sensitivity. He has no concern for STDs. He had a normal PSA last week.       Review of Systems See HPI   Past Medical History:  Diagnosis Date   AF (atrial fibrillation) (HCC)    HA (headache)    Keratosis, actinic    head   Obstructive sleep apnea 2004   mild osa --AHI 7/hr.   Vertigo     Social History   Socioeconomic History   Marital status: Divorced    Spouse name: Not on file   Number of children: Not on file   Years of education: Not on file   Highest education level: Not on file  Occupational History   Not on file  Tobacco Use   Smoking status: Former    Current packs/day: 0.00    Types: Cigarettes    Quit date: 08/16/1983    Years since quitting: 40.2   Smokeless tobacco: Never  Substance and Sexual Activity   Alcohol use: Yes    Alcohol/week:  2.0 standard drinks of alcohol    Types: 2 Cans of beer per week   Drug use: No   Sexual activity: Not on file  Other Topics Concern   Not on file  Social History Narrative   Not on file   Social Drivers of Health   Financial Resource Strain: Not on file  Food Insecurity: Not on file  Transportation Needs: Not on file  Physical Activity: Not on file  Stress: Not on file  Social Connections: Not on file  Intimate Partner Violence: Not on file    Past Surgical History:  Procedure Laterality Date   NECK SURGERY     at 63 years old    WISDOM TOOTH EXTRACTION      Family History  Problem Relation Age of Onset   Melanoma Mother     No Known Allergies  Current Outpatient Medications on File Prior to Visit  Medication Sig Dispense Refill   aspirin EC 81 MG tablet Take 81 mg by mouth daily.     digoxin (LANOXIN) 0.25 MG tablet TAKE 1 TABLET BY MOUTH EVERY DAY 90 tablet 3   famotidine (PEPCID) 20 MG tablet Take 1 tablet (20 mg total) by mouth 2 (two) times daily. (Patient taking differently: Take 20 mg by mouth as needed.) 30 tablet 0   hydrocortisone (ANUSOL-HC) 2.5 % rectal  cream Place 1 Application rectally daily as needed for hemorrhoids or anal itching. 30 g 0   hydrocortisone (ANUSOL-HC) 25 MG suppository Place 1 suppository (25 mg total) rectally 2 (two) times daily as needed for hemorrhoids. 24 suppository 0   LORazepam (ATIVAN) 0.5 MG tablet Take 1 tablet (0.5 mg total) by mouth every 6 (six) hours as needed for anxiety. 45 tablet 2   omeprazole (PRILOSEC) 20 MG capsule TAKE 1 CAPSULE BY MOUTH EVERY DAY 90 capsule 1   quiniDINE gluconate 324 MG CR tablet TAKE 2 TABLETS IN THE MORNING, 2 TABLETS IN THE AFTERNOON, AND 2 TABLETS IN THE EVENING 180 tablet 3   sildenafil (REVATIO) 20 MG tablet Take 1 tablet (20 mg total) by mouth as needed. Take 1 to 2 tabs 2 - 3 hours before sex 20 tablet 6   tamsulosin (FLOMAX) 0.4 MG CAPS capsule Take 1 capsule (0.4 mg total) by mouth daily.  90 capsule 3   No current facility-administered medications on file prior to visit.    BP 138/80   Pulse 72   Temp 98 F (36.7 C) (Oral)   Ht 5\' 11"  (1.803 m)   Wt 186 lb (84.4 kg)   SpO2 97%   BMI 25.94 kg/m       Objective:   Physical Exam Vitals and nursing note reviewed.  Constitutional:      Appearance: Normal appearance.  Genitourinary:    Testes:        Right: Mass present. Tenderness, swelling, testicular hydrocele or varicocele not present. Right testis is descended. Cremasteric reflex is present.         Left: Mass, tenderness, swelling, testicular hydrocele or varicocele not present. Left testis is descended. Cremasteric reflex is present.      Comments: Small cyst on right testicle that he has had for a number of years   Musculoskeletal:        General: Normal range of motion.  Skin:    General: Skin is warm.  Neurological:     General: No focal deficit present.     Mental Status: He is alert and oriented to person, place, and time.  Psychiatric:        Mood and Affect: Mood normal.        Behavior: Behavior normal.        Thought Content: Thought content normal.        Judgment: Judgment normal.       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 No concern for STDs or UTIs. Normal PSA. Possible mechanical trauma from sitting on hard surfaces. -Refer to Urology for further evaluation, possibly including cystoscopy and MRI. UA normal     2. Need for shingles vaccine - Follow up in 2 months for second vaccination - Zoster Recombinant (Shingrix )  Shirline Frees, NP

## 2023-11-02 ENCOUNTER — Ambulatory Visit: Payer: 59 | Admitting: Adult Health

## 2023-11-08 ENCOUNTER — Ambulatory Visit: Admitting: Family Medicine

## 2023-11-08 ENCOUNTER — Other Ambulatory Visit: Payer: Self-pay | Admitting: Adult Health

## 2023-11-08 ENCOUNTER — Ambulatory Visit: Payer: Self-pay | Admitting: Adult Health

## 2023-11-08 VITALS — BP 194/86 | HR 75 | Temp 97.9°F | Ht 71.0 in | Wt 186.4 lb

## 2023-11-08 DIAGNOSIS — I10 Essential (primary) hypertension: Secondary | ICD-10-CM | POA: Insufficient documentation

## 2023-11-08 MED ORDER — OLMESARTAN MEDOXOMIL 20 MG PO TABS
20.0000 mg | ORAL_TABLET | Freq: Every day | ORAL | 0 refills | Status: DC
Start: 2023-11-08 — End: 2024-01-31

## 2023-11-08 NOTE — Progress Notes (Signed)
 Acute Office Visit   Subjective:  Patient ID: Jeff Gonzales, male    DOB: July 14, 1961, 63 y.o.   MRN: [id number]  Chief Complaint  Patient presents with   Hypertension    HPI Patient is here for an acute visit for elevated blood pressure without a diagnoses of hypertension. He reports he was at [facility] yesterday with an elevated blood pressure, 180's/unknown diasystolic. He reports he has been monitoring his blood pressure since yesterday. Ranging 150-190/85-100. Denies CP, SHOB, HA, dizziness, lightheadedness, or lower extremity edema. He reports he does eat a lot of salty foods, such as chips, chews, and chocolate. He reports he does participate in regular exercise, such as swimming.  Has a family history of HTN. Also, he reports he has had some increased stress between caring for his elderly parents and daughter. He reports he has been out of Lorazepam since last night, when he took his last pill, and can not get medication until 03/08.   BP Readings from Last 3 Encounters:  11/08/23 (!) 194/86  10/28/23 138/80  10/19/23 (!) 138/100    ROS See HPI above      Objective:   BP (!) 194/86 (BP Location: Left Arm, Patient Position: Sitting, Cuff Size: Large)   Pulse 75   Temp 97.9 F (36.6 C) (Oral)   Ht 5\' 11"  (1.803 m)   Wt 186 lb 6.4 oz (84.6 kg)   SpO2 98%   BMI 26.00 kg/m    Physical Exam Vitals reviewed.  Constitutional:      General: He is not in acute distress.    Appearance: Normal appearance. He is not ill-appearing, toxic-appearing or diaphoretic.  Eyes:     General:        Right eye: No discharge.        Left eye: No discharge.     Conjunctiva/sclera: Conjunctivae normal.  Cardiovascular:     Rate and Rhythm: Normal rate and regular rhythm.     Heart sounds: Normal heart sounds. No murmur heard.    No friction rub. No gallop.  Pulmonary:     Effort: Pulmonary effort is normal. No respiratory distress.     Breath sounds: Normal breath sounds.   Musculoskeletal:        General: Normal range of motion.  Skin:    General: Skin is warm and dry.  Neurological:     General: No focal deficit present.     Mental Status: He is alert and oriented to person, place, and time. Mental status is at baseline.  Psychiatric:        Mood and Affect: Mood normal.        Behavior: Behavior normal.        Thought Content: Thought content normal.        Judgment: Judgment normal.       Assessment & Plan:  Hypertension, unspecified type Assessment & Plan: Diagnosed with hypertension with multiple readings being above 140/90. Prescribed Olmesartan 20mg  tablet, take 1 tablet daily. Choose this medication because he does have some borderline normal readings. Did not want to drop his pressure too low.  Recommend to take his blood pressure twice a day with an upper arm blood pressure cuff. Write down readings and bring to his next appointment in two weeks. Discussed about red flags of when it is appropriate to be seen at the emergency department. Also, provided him general information about hypertension and dash diet. Also, dicussed about decreasing salt intake.  Orders: -     Olmesartan Medoxomil; Take 1 tablet (20 mg total) by mouth daily for 90 doses.  Dispense: 90 tablet; Refill: 0  *Spoke with Alma Downs, NP who is going to be sending in additional Lorazepam for his anxiety.   Return in about 2 weeks (around 11/22/2023) for  with Shirline Frees, NP .  Zandra Abts, NP

## 2023-11-08 NOTE — Assessment & Plan Note (Signed)
 Diagnosed with hypertension with multiple readings being above 140/90. Prescribed Olmesartan 20mg  tablet, take 1 tablet daily. Choose this medication because he does have some borderline normal readings. Did not want to drop his pressure too low.  Recommend to take his blood pressure twice a day with an upper arm blood pressure cuff. Write down readings and bring to his next appointment in two weeks. Discussed about red flags of when it is appropriate to be seen at the emergency department. Also, provided him general information about hypertension and dash diet. Also, dicussed about decreasing salt intake.

## 2023-11-08 NOTE — Telephone Encounter (Signed)
  Chief Complaint: elevated BP at urology office yesterday  Symptoms: BP 189/102 HR 79 sitting in chair. Asymptomatic. Frequency: yesterday  Pertinent Negatives: Patient denies chest pain no difficulty breathing no blurred vision  Disposition: [] ED /[] Urgent Care (no appt availability in office) / [x] Appointment(In office/virtual)/ []  [facility]/ [] Home Care/ [] Refused Recommended Disposition /[] [facility]/ []  Follow-up with PCP Additional Notes:   Recommended to drink more water appt scheduled for today .    Copied from CRM [id number]. Topic: Clinical - Red Word Triage >> Nov 08, 2023  8:56 AM Irine Seal wrote: [facility] that prompted transfer to Nurse Triage: The patient reports experiencing hypertension over the last few days, with blood pressure readings ranging from 180 to 190/90s. The patient denies any other symptoms and has no history of hypertension. He wants same day appt, he tried to schedule via mychart but PCP was not available till tomorrow and he does not want to wait Reason for Disposition  Systolic BP  >= 180 OR Diastolic >= 110  Answer Assessment - Initial Assessment Questions 1. BLOOD PRESSURE: "What is the blood pressure?" "Did you take at least two measurements 5 minutes apart?"     BP 189/102 HR 79  did not recheck due to patient started walking around 2. ONSET: "When did you take your blood pressure?"     Now  3. HOW: "How did you take your blood pressure?" (e.g., automatic home BP monitor, visiting nurse)     Automatic BP  4. HISTORY: "Do you have a history of high blood pressure?"     No  5. MEDICINES: "Are you taking any medicines for blood pressure?" "Have you missed any doses recently?"     No  6. OTHER SYMPTOMS: "Do you have any symptoms?" (e.g., blurred vision, chest pain, difficulty breathing, headache, weakness)     None  7. PREGNANCY: "Is there any chance you are pregnant?" "When was your last menstrual period?"     na  Protocols  used: Blood Pressure - High-A-AH

## 2023-11-08 NOTE — Patient Instructions (Signed)
-  Diagnosed with hypertension with multiple readings being above 140/90. -START Olmesartan 20mg  tablet, take 1 tablet daily.  -Recommend to take your blood pressure twice a day with an upper arm blood pressure cuff. Write down readings and bring to your next appointment in two weeks.  -Discussed about red flags of when it is appropriate to be seen at the emergency department.  -Follow up in 2 weeks with Jeff Frees, NP.

## 2023-11-10 ENCOUNTER — Telehealth: Payer: Self-pay

## 2023-11-10 NOTE — Telephone Encounter (Signed)
 Pt notified that Ativan per pharmacy cannot be filled early. The earliest date would be 3/8. Pt verbalized understanding. Pt was also advised that if he is using the astivan more that per [facility] he may need to reassess to see if there are other medication that he can use. Pt declined  pt stated that he does not need to change the medication. Pt is worried about becoming suicidal with the other medications.

## 2023-11-22 ENCOUNTER — Encounter: Payer: Self-pay | Admitting: Adult Health

## 2023-11-22 ENCOUNTER — Ambulatory Visit: Admitting: Adult Health

## 2023-11-22 VITALS — BP 138/62 | HR 68 | Temp 97.8°F | Ht 71.0 in | Wt 187.0 lb

## 2023-11-22 DIAGNOSIS — I10 Essential (primary) hypertension: Secondary | ICD-10-CM | POA: Diagnosis not present

## 2023-11-22 LAB — BASIC METABOLIC PANEL
BUN: 18 mg/dL (ref 6–23)
CO2: 30 meq/L (ref 19–32)
Calcium: 9.5 mg/dL (ref 8.4–10.5)
Chloride: 101 meq/L (ref 96–112)
Creatinine, Ser: 1.15 mg/dL (ref 0.40–1.50)
GFR: 67.17 mL/min (ref >60.00)
Glucose, Bld: 106 mg/dL — ABNORMAL HIGH (ref 70–99)
Potassium: 4 meq/L (ref 3.5–5.1)
Sodium: 138 meq/L (ref 135–145)

## 2023-11-22 NOTE — Progress Notes (Signed)
 Subjective:    Patient ID: Jeff Gonzales, male    DOB: Feb 15, 1961, 63 y.o.   MRN: [id number]  HPI 63 year old male who  has a past medical history of AF (atrial fibrillation) (HCC), HA (headache), Keratosis, actinic, Obstructive sleep apnea (2004), and Vertigo.  He presents to the office today for 2-week follow-up regarding hypertension.  He was seen in the office by another provider on 11/08/2023 for elevated blood pressure readings.  He was at [facility] urology the day prior it was noted that his blood pressure was 180 systolic.  He had been monitoring his blood pressure at home since that day with readings in the 150s to 190s over 85-2 100s.  He denied chest pain, shortness of breath, headache, dizziness, lightheadedness, or lower extremity edema.  Does have a family history of hypertension.  He was started on olmesartan 20 mg daily.  He has been checking his blood pressure at home with readings trending down over the last two weeks into the 112-130/80's. He has cut back on sodium. He has not had any dizziness, lightheadedness, blurred vision or headaches. He does reports that he has noticed that he is feeling better since starting blood pressure medication and feels as though his anxiety is less severe.     Review of Systems See HPI   Past Medical History:  Diagnosis Date   AF (atrial fibrillation) (HCC)    HA (headache)    Keratosis, actinic    head   Obstructive sleep apnea 2004   mild osa --AHI 7/hr.   Vertigo     Social History   Socioeconomic History   Marital status: Divorced    Spouse name: Not on file   Number of children: Not on file   Years of education: Not on file   Highest education level: Bachelor's degree (e.g., BA, AB, BS)  Occupational History   Not on file  Tobacco Use   Smoking status: Former    Current packs/day: 0.00    Types: Cigarettes    Quit date: 08/16/1983    Years since quitting: 40.2   Smokeless tobacco: Never  Substance and Sexual  Activity   Alcohol use: Yes    Alcohol/week: 2.0 standard drinks of alcohol    Types: 2 Cans of beer per week   Drug use: No   Sexual activity: Not on file  Other Topics Concern   Not on file  Social History Narrative   Not on file   Social Drivers of Health   Financial Resource Strain: Low Risk  (11/08/2023)   Overall Financial Resource Strain (CARDIA)    Difficulty of Paying Living Expenses: Not very hard  Food Insecurity: No Food Insecurity (11/08/2023)   Hunger Vital Sign    Worried About Running Out of Food in the Last Year: Never true    Ran Out of Food in the Last Year: Never true  Transportation Needs: No Transportation Needs (11/08/2023)   PRAPARE - Administrator, Civil Service (Medical): No    Lack of Transportation (Non-Medical): No  Physical Activity: Sufficiently Active (11/08/2023)   Exercise Vital Sign    Days of Exercise per Week: 3 days    Minutes of Exercise per Session: 60 min  Stress: Stress Concern Present (11/08/2023)   [employer] of Occupational Health - Occupational Stress Questionnaire    Feeling of Stress : To some extent  Social Connections: Socially Isolated (11/08/2023)   Social Connection and Isolation Panel [NHANES]  Frequency of Communication with Friends and Family: Three times a week    Frequency of Social Gatherings with Friends and Family: Three times a week    Attends Religious Services: Never    Active Member of Clubs or Organizations: No    Attends Engineer, structural: Not on file    Marital Status: Divorced  Catering manager Violence: Not on file    Past Surgical History:  Procedure Laterality Date   NECK SURGERY     at 63 years old    WISDOM TOOTH EXTRACTION      Family History  Problem Relation Age of Onset   Melanoma Mother     No Known Allergies  Current Outpatient Medications on File Prior to Visit  Medication Sig Dispense Refill   aspirin EC 81 MG tablet Take 81 mg by mouth daily.     digoxin  (LANOXIN) 0.25 MG tablet TAKE 1 TABLET BY MOUTH EVERY DAY 90 tablet 3   famotidine (PEPCID) 20 MG tablet Take 1 tablet (20 mg total) by mouth 2 (two) times daily. (Patient taking differently: Take 20 mg by mouth as needed.) 30 tablet 0   hydrocortisone (ANUSOL-HC) 2.5 % rectal cream Place 1 Application rectally daily as needed for hemorrhoids or anal itching. 30 g 0   hydrocortisone (ANUSOL-HC) 25 MG suppository Place 1 suppository (25 mg total) rectally 2 (two) times daily as needed for hemorrhoids. 24 suppository 0   olmesartan (BENICAR) 20 MG tablet Take 1 tablet (20 mg total) by mouth daily for 90 doses. 90 tablet 0   quiniDINE gluconate 324 MG CR tablet TAKE 2 TABLETS IN THE MORNING, 2 TABLETS IN THE AFTERNOON, AND 2 TABLETS IN THE EVENING 180 tablet 3   sildenafil (REVATIO) 20 MG tablet Take 1 tablet (20 mg total) by mouth as needed. Take 1 to 2 tabs 2 - 3 hours before sex 20 tablet 6   tamsulosin (FLOMAX) 0.4 MG CAPS capsule Take 1 capsule (0.4 mg total) by mouth daily. 90 capsule 3   No current facility-administered medications on file prior to visit.    BP 138/62   Pulse 68   Temp 97.8 F (36.6 C) (Oral)   Ht 5\' 11"  (1.803 m)   Wt 187 lb (84.8 kg)   SpO2 99%   BMI 26.08 kg/m       Objective:   Physical Exam Vitals and nursing note reviewed.  Constitutional:      Appearance: Normal appearance.  Cardiovascular:     Rate and Rhythm: Normal rate and regular rhythm.     Pulses: Normal pulses.     Heart sounds: Normal heart sounds.  Pulmonary:     Effort: Pulmonary effort is normal.     Breath sounds: Normal breath sounds.  Musculoskeletal:        General: Normal range of motion.  Skin:    General: Skin is warm and dry.     Capillary Refill: Capillary refill takes less than 2 seconds.  Neurological:     General: No focal deficit present.     Mental Status: He is alert and oriented to person, place, and time.  Psychiatric:        Mood and Affect: Mood normal.         Behavior: Behavior normal.        Thought Content: Thought content normal.        Judgment: Judgment normal.       Assessment & Plan:  1. Hypertension, unspecified type (  Primary) - BP trending down  - Will have him continue to monitor his BP at home, He will get benefit over the next 2 -3 weeks. We may need to adjust his dosing. He will send me mychart message with his readings in 2 weeks.  - Basic Metabolic Panel; Future  Shirline Frees, NP

## 2023-11-23 ENCOUNTER — Other Ambulatory Visit: Payer: Self-pay | Admitting: Adult Health

## 2023-11-23 NOTE — Telephone Encounter (Signed)
 Ok to fill

## 2023-12-20 ENCOUNTER — Ambulatory Visit: Payer: 59 | Admitting: Neurology

## 2023-12-27 ENCOUNTER — Ambulatory Visit (INDEPENDENT_AMBULATORY_CARE_PROVIDER_SITE_OTHER): Payer: 59

## 2023-12-27 DIAGNOSIS — Z23 Encounter for immunization: Secondary | ICD-10-CM

## 2023-12-27 NOTE — Progress Notes (Unsigned)
Per orders of Shirline Frees, NP, injection of 2nd Shingles vaccine given by Sherrin Daisy. Patient tolerated injection well.

## 2024-01-30 ENCOUNTER — Encounter: Payer: Self-pay | Admitting: Adult Health

## 2024-01-30 DIAGNOSIS — I10 Essential (primary) hypertension: Secondary | ICD-10-CM

## 2024-01-31 MED ORDER — OLMESARTAN MEDOXOMIL 20 MG PO TABS
20.0000 mg | ORAL_TABLET | Freq: Every day | ORAL | 2 refills | Status: AC
Start: 2024-01-31 — End: 2024-05-17

## 2024-03-07 ENCOUNTER — Encounter: Payer: Self-pay | Admitting: Adult Health

## 2024-03-14 ENCOUNTER — Other Ambulatory Visit: Payer: Self-pay | Admitting: Adult Health

## 2024-03-14 MED ORDER — LORAZEPAM 0.5 MG PO TABS: 0.5000 mg | ORAL_TABLET | Freq: Four times a day (QID) | ORAL | 2 refills | Status: DC | PRN

## 2024-03-14 NOTE — Telephone Encounter (Signed)
**Note De-identified  [place] Obfuscation** Please advise 

## 2024-05-10 ENCOUNTER — Other Ambulatory Visit: Payer: Self-pay | Admitting: Adult Health

## 2024-05-10 DIAGNOSIS — I4891 Unspecified atrial fibrillation: Secondary | ICD-10-CM

## 2024-05-17 ENCOUNTER — Encounter: Payer: Self-pay | Admitting: Adult Health

## 2024-05-17 ENCOUNTER — Ambulatory Visit: Admitting: Adult Health

## 2024-05-17 VITALS — BP 130/80 | HR 80 | Temp 98.0°F | Ht 71.0 in | Wt 188.0 lb

## 2024-05-17 DIAGNOSIS — K5900 Constipation, unspecified: Secondary | ICD-10-CM | POA: Diagnosis not present

## 2024-05-17 DIAGNOSIS — R195 Other fecal abnormalities: Secondary | ICD-10-CM

## 2024-05-17 NOTE — Progress Notes (Signed)
 Subjective:    Patient ID: Jeff Gonzales, male    DOB: 08-07-1961, 63 y.o.   MRN: [id number]  HPI  63 year old male who  has a past medical history of AF (atrial fibrillation) (HCC), HA (headache), Keratosis, actinic, Obstructive sleep apnea (2004), and Vertigo.  Discussed the use of AI scribe software for clinical note transcription with the patient, who gave verbal consent to proceed.  History of Present Illness   He presents to the office today for an acute visit.   He has known history of internal hemorrhoids and constipation. Has been using Metamucil three times a day and reports that these symptoms have improved.   He reports a new symptom of dark brown mucus in his stool, occurring four times over the last few months,  most recently the day before the visit. This occurs during the second or third bowel movement of the day, which are looser. He now averages two bowel movements per day after increasing fiber intake, with the first often starting with hard pebbles. He is trying to increase his water intake.  He denies abdominal pain, nausea, vomiting, fever, or significant changes in stool color. He occasionally feels abdominal sensations linked to stress or anxiety, which resolve spontaneously. He is physically active and feels generally healthy. He has not had a colonoscopy but completed Cologuard testing in the past.       Review of Systems See HPI   Past Medical History:  Diagnosis Date   AF (atrial fibrillation) (HCC)    HA (headache)    Keratosis, actinic    head   Obstructive sleep apnea 2004   mild osa --AHI 7/hr.   Vertigo     Social History   Socioeconomic History   Marital status: Divorced    Spouse name: Not on file   Number of children: Not on file   Years of education: Not on file   Highest education level: Bachelor's degree (e.g., BA, AB, BS)  Occupational History   Not on file  Tobacco Use   Smoking status: Former    Current packs/day: 0.00     Types: Cigarettes    Quit date: 08/16/1983    Years since quitting: 40.7   Smokeless tobacco: Never  Substance and Sexual Activity   Alcohol use: Yes    Alcohol/week: 2.0 standard drinks of alcohol    Types: 2 Cans of beer per week   Drug use: No   Sexual activity: Not on file  Other Topics Concern   Not on file  Social History Narrative   Not on file   Social Drivers of Health   Financial Resource Strain: Low Risk  (11/08/2023)   Overall Financial Resource Strain (CARDIA)    Difficulty of Paying Living Expenses: Not very hard  Food Insecurity: No Food Insecurity (11/08/2023)   Hunger Vital Sign    Worried About Running Out of Food in the Last Year: Never true    Ran Out of Food in the Last Year: Never true  Transportation Needs: No Transportation Needs (11/08/2023)   PRAPARE - Administrator, Civil Service (Medical): No    Lack of Transportation (Non-Medical): No  Physical Activity: Sufficiently Active (11/08/2023)   Exercise Vital Sign    Days of Exercise per Week: 3 days    Minutes of Exercise per Session: 60 min  Stress: Stress Concern Present (11/08/2023)   [employer] of Occupational Health - Occupational Stress Questionnaire    Feeling of Stress :  To some extent  Social Connections: Socially Isolated (11/08/2023)   Social Connection and Isolation Panel    Frequency of Communication with Friends and Family: Three times a week    Frequency of Social Gatherings with Friends and Family: Three times a week    Attends Religious Services: Never    Active Member of Clubs or Organizations: No    Attends Engineer, structural: Not on file    Marital Status: Divorced  Catering manager Violence: Not on file    Past Surgical History:  Procedure Laterality Date   NECK SURGERY     at 63 years old    WISDOM TOOTH EXTRACTION      Family History  Problem Relation Age of Onset   Melanoma Mother     No Known Allergies  Current Outpatient Medications on File  Prior to Visit  Medication Sig Dispense Refill   aspirin EC 81 MG tablet Take 81 mg by mouth daily.     digoxin  (LANOXIN ) 0.25 MG tablet TAKE 1 TABLET BY MOUTH EVERY DAY 90 tablet 3   famotidine  (PEPCID ) 20 MG tablet Take 1 tablet (20 mg total) by mouth 2 (two) times daily. (Patient taking differently: Take 20 mg by mouth as needed.) 30 tablet 0   hydrocortisone  (ANUSOL -HC) 2.5 % rectal cream Place 1 Application rectally daily as needed for hemorrhoids or anal itching. 30 g 0   hydrocortisone  (ANUSOL -HC) 25 MG suppository Place 1 suppository (25 mg total) rectally 2 (two) times daily as needed for hemorrhoids. 24 suppository 0   LORazepam  (ATIVAN ) 0.5 MG tablet Take 1 tablet (0.5 mg total) by mouth every 6 (six) hours as needed for anxiety. 45 tabs a month 30 tablet 2   olmesartan  (BENICAR ) 20 MG tablet Take 1 tablet (20 mg total) by mouth daily for 90 doses. 90 tablet 2   quiniDINE gluconate  324 MG CR tablet TAKE 2 TABLETS IN THE MORNING, 2 TABLETS IN THE AFTERNOON, AND 2 TABLETS IN THE EVENING 180 tablet 3   sildenafil  (REVATIO ) 20 MG tablet Take 1 tablet (20 mg total) by mouth as needed. Take 1 to 2 tabs 2 - 3 hours before sex 20 tablet 6   tamsulosin  (FLOMAX ) 0.4 MG CAPS capsule Take 1 capsule (0.4 mg total) by mouth daily. 90 capsule 3   No current facility-administered medications on file prior to visit.    BP 130/80   Pulse 80   Temp 98 F (36.7 C) (Oral)   Ht 5' 11 (1.803 m)   Wt 188 lb (85.3 kg)   SpO2 97%   BMI 26.22 kg/m       Objective:   Physical Exam Vitals and nursing note reviewed.  Constitutional:      Appearance: Normal appearance.  Cardiovascular:     Rate and Rhythm: Normal rate and regular rhythm.     Pulses: Normal pulses.     Heart sounds: Normal heart sounds.  Musculoskeletal:        General: Normal range of motion.  Skin:    General: Skin is warm and dry.  Neurological:     General: No focal deficit present.     Mental Status: He is alert and  oriented to person, place, and time.  Psychiatric:        Mood and Affect: Mood normal.        Behavior: Behavior normal.        Thought Content: Thought content normal.        Judgment:  Judgment normal.       Assessment & Plan:  1. Mucus in stool (Primary) - No signs of infection, inflammation or IBS. Suspect this is related to a degree of constipation. Continue with metamucil and increase fluids. If symptoms continue can refer to GI, he is in agreement.   2. Constipation, unspecified constipation type  I personally spent a total of 32 minutes in the care of the patient today including preparing to see the patient, getting/reviewing separately obtained history, performing a medically appropriate exam/evaluation, counseling and educating, placing orders, and documenting clinical information in the EHR.

## 2024-07-23 ENCOUNTER — Other Ambulatory Visit: Payer: Self-pay | Admitting: Adult Health

## 2024-08-22 ENCOUNTER — Other Ambulatory Visit (INDEPENDENT_AMBULATORY_CARE_PROVIDER_SITE_OTHER): Payer: Self-pay | Admitting: Internal Medicine

## 2024-09-04 ENCOUNTER — Other Ambulatory Visit: Payer: Self-pay | Admitting: Adult Health

## 2024-09-04 DIAGNOSIS — I4891 Unspecified atrial fibrillation: Secondary | ICD-10-CM

## 2024-09-29 ENCOUNTER — Other Ambulatory Visit: Payer: Self-pay | Admitting: Adult Health

## 2024-09-29 DIAGNOSIS — N401 Enlarged prostate with lower urinary tract symptoms: Secondary | ICD-10-CM

## 2024-09-29 DIAGNOSIS — I4891 Unspecified atrial fibrillation: Secondary | ICD-10-CM
